# Patient Record
Sex: Female | Born: 1970 | Race: Black or African American | Hispanic: No | Smoking: Current every day smoker
Health system: Southern US, Community
[De-identification: ages and names within clinical notes are randomized; demographics above are authoritative.]

## PROBLEM LIST (undated history)

## (undated) DIAGNOSIS — T40601A Poisoning by unspecified narcotics, accidental (unintentional), initial encounter: Secondary | ICD-10-CM

## (undated) DIAGNOSIS — R569 Unspecified convulsions: Secondary | ICD-10-CM

## (undated) DIAGNOSIS — R55 Syncope and collapse: Secondary | ICD-10-CM

## (undated) HISTORY — PX: LEEP: SHX91

---

## 1997-03-22 ENCOUNTER — Ambulatory Visit (HOSPITAL_COMMUNITY): Admission: RE | Admit: 1997-03-22 | Discharge: 1997-03-22 | Payer: Self-pay | Admitting: Obstetrics

## 1998-09-06 ENCOUNTER — Encounter: Admission: RE | Admit: 1998-09-06 | Discharge: 1998-09-06 | Payer: Self-pay | Admitting: Obstetrics & Gynecology

## 1998-09-06 ENCOUNTER — Other Ambulatory Visit: Admission: RE | Admit: 1998-09-06 | Discharge: 1998-09-06 | Payer: Self-pay | Admitting: Obstetrics & Gynecology

## 1998-09-22 ENCOUNTER — Encounter: Admission: RE | Admit: 1998-09-22 | Discharge: 1998-09-22 | Payer: Self-pay | Admitting: Obstetrics

## 2003-06-11 ENCOUNTER — Emergency Department (HOSPITAL_COMMUNITY): Admission: EM | Admit: 2003-06-11 | Discharge: 2003-06-11 | Payer: Self-pay | Admitting: Emergency Medicine

## 2004-12-24 ENCOUNTER — Inpatient Hospital Stay (HOSPITAL_COMMUNITY): Admission: AD | Admit: 2004-12-24 | Discharge: 2004-12-24 | Payer: Self-pay | Admitting: *Deleted

## 2004-12-25 ENCOUNTER — Inpatient Hospital Stay (HOSPITAL_COMMUNITY): Admission: AD | Admit: 2004-12-25 | Discharge: 2004-12-25 | Payer: Self-pay | Admitting: Obstetrics and Gynecology

## 2006-03-27 ENCOUNTER — Emergency Department (HOSPITAL_COMMUNITY): Admission: EM | Admit: 2006-03-27 | Discharge: 2006-03-27 | Payer: Self-pay | Admitting: Family Medicine

## 2007-07-25 ENCOUNTER — Emergency Department (HOSPITAL_COMMUNITY): Admission: EM | Admit: 2007-07-25 | Discharge: 2007-07-25 | Payer: Self-pay | Admitting: Emergency Medicine

## 2007-07-29 ENCOUNTER — Emergency Department (HOSPITAL_COMMUNITY): Admission: EM | Admit: 2007-07-29 | Discharge: 2007-07-29 | Payer: Self-pay | Admitting: Emergency Medicine

## 2008-05-13 ENCOUNTER — Emergency Department (HOSPITAL_COMMUNITY): Admission: EM | Admit: 2008-05-13 | Discharge: 2008-05-13 | Payer: Self-pay | Admitting: Emergency Medicine

## 2008-10-06 ENCOUNTER — Emergency Department (HOSPITAL_COMMUNITY): Admission: EM | Admit: 2008-10-06 | Discharge: 2008-10-06 | Payer: Self-pay | Admitting: Emergency Medicine

## 2010-07-25 ENCOUNTER — Inpatient Hospital Stay (INDEPENDENT_AMBULATORY_CARE_PROVIDER_SITE_OTHER)
Admission: RE | Admit: 2010-07-25 | Discharge: 2010-07-25 | Disposition: A | Discharge status: Home / Self Care | Payer: Self-pay | Source: Ambulatory Visit | Attending: Family Medicine | Admitting: Family Medicine

## 2010-07-25 DIAGNOSIS — L03119 Cellulitis of unspecified part of limb: Secondary | ICD-10-CM

## 2010-07-28 ENCOUNTER — Inpatient Hospital Stay (INDEPENDENT_AMBULATORY_CARE_PROVIDER_SITE_OTHER)
Admission: RE | Admit: 2010-07-28 | Discharge: 2010-07-28 | Disposition: A | Discharge status: Home / Self Care | Payer: Self-pay | Source: Ambulatory Visit | Attending: Emergency Medicine | Admitting: Emergency Medicine

## 2010-07-28 DIAGNOSIS — L03119 Cellulitis of unspecified part of limb: Secondary | ICD-10-CM

## 2010-11-16 LAB — CULTURE, ROUTINE-ABSCESS

## 2011-05-09 ENCOUNTER — Emergency Department (INDEPENDENT_AMBULATORY_CARE_PROVIDER_SITE_OTHER)
Admission: EM | Admit: 2011-05-09 | Discharge: 2011-05-09 | Disposition: A | Discharge status: Home / Self Care | Payer: Self-pay | Source: Home / Self Care | Attending: Emergency Medicine | Admitting: Emergency Medicine

## 2011-05-09 ENCOUNTER — Encounter (HOSPITAL_COMMUNITY): Payer: Self-pay | Admitting: *Deleted

## 2011-05-09 DIAGNOSIS — H109 Unspecified conjunctivitis: Secondary | ICD-10-CM

## 2011-05-09 MED ORDER — ERYTHROMYCIN 5 MG/GM OP OINT: TOPICAL_OINTMENT | OPHTHALMIC | Status: AC

## 2011-05-09 MED ORDER — CIPROFLOXACIN HCL 0.3 % OP SOLN: OPHTHALMIC | Status: DC

## 2011-05-09 NOTE — ED Notes (Signed)
Pt  Reports  Symptoms  Of  Red   Irritated  r  Eye     Which  Started    Yesterday         Woke  Up  This  Am  With the  Eye  Matted somewhat       Pt  Has  allergys    And  Thought at first  That  That  Was the problem

## 2011-05-09 NOTE — Discharge Instructions (Signed)
Conjunctivitis Conjunctivitis is commonly called "pink eye." Conjunctivitis can be caused by bacterial or viral infection, allergies, or injuries. There is usually redness of the lining of the eye, itching, discomfort, and sometimes discharge. There may be deposits of matter along the eyelids. A viral infection usually causes a watery discharge, while a bacterial infection causes a yellowish, thick discharge. Pink eye is very contagious and spreads by direct contact. You may be given antibiotic eyedrops as part of your treatment. Before using your eye medicine, remove all drainage from the eye by washing gently with warm water and cotton balls. Continue to use the medication until you have awakened 2 mornings in a row without discharge from the eye. Do not rub your eye. This increases the irritation and helps spread infection. Use separate towels from other household members. Wash your hands with soap and water before and after touching your eyes. Use cold compresses to reduce pain and sunglasses to relieve irritation from light. Do not wear contact lenses or wear eye makeup until the infection is gone. SEEK MEDICAL CARE IF:   Your symptoms are not better after 3 days of treatment.   You have increased pain or trouble seeing.   The outer eyelids become very red or swollen.  Document Released: 03/15/2004 Document Revised: 01/25/2011 Document Reviewed: 02/05/2005 Exit[employer] Patient Information 2012 [employer]. 

## 2011-05-09 NOTE — ED Provider Notes (Signed)
Chief Complaint  Patient presents with  . Eye Problem    History of Present Illness:   The patient is a 41 year old female who has had a two-day history of redness and irritation of the right eye. Her vision has been somewhat blurred. She's had yellowish drainage with crusting, pain and photophobia. She also notes nasal congestion, rhinorrhea and cough. No fever or sore throat.  Review of Systems:  Other than noted above, the patient denies any of the following symptoms: Systemic:  No fever, chills, sweats, fatigue, or weight loss. Eye:  No redness, eye pain, photophobia, discharge, blurred vision, or diplopia. ENT:  No nasal congestion, rhinorrhea, or sore throat. Lymphatic:  No adenopathy. Skin:  No rash or pruritis.  PMFSH:  Past medical history, family history, social history, meds, and allergies were reviewed.  Physical Exam:   Vital signs:  BP 112/84  Pulse 95  Temp(Src) 98.5 F (36.9 C) (Oral)  Resp 18  SpO2 100%  LMP 04/16/2011 General:  Alert and in no distress. Eye:  Conjunctiva of the right eye was injected and there was some crusted yellowish drainage. The anterior chamber was normal and cornea was intact. ENT:  TMs and canals clear.  Nasal mucosa normal.  No intra-oral lesions, mucous membranes moist, pharynx clear. Neck:  No adenopathy tenderness or mass. Skin:  Clear, warm and dry.  Assessment:   Diagnoses that have been ruled out:  None  Diagnoses that are still under consideration:  None  Final diagnoses:  Conjunctivitis    Plan:   1.  The following meds were prescribed:   New Prescriptions   CIPROFLOXACIN (CILOXAN) 0.3 % OPHTHALMIC SOLUTION    1 drop in right eye every 3 hours while awake.   ERYTHROMYCIN OPHTHALMIC OINTMENT    Place a 1/2 inch ribbon of ointment into the lower eyelid at HS.   2.  The patient was instructed in symptomatic care and handouts were given. 3.  The patient was told to return if becoming worse in any way, if no better in 3 or 4  days, and given some red flag symptoms that would indicate earlier return.     Reuben Likes, MD 05/09/11 [phone]

## 2011-08-24 ENCOUNTER — Encounter (HOSPITAL_COMMUNITY): Payer: Self-pay | Admitting: *Deleted

## 2011-08-24 ENCOUNTER — Emergency Department (HOSPITAL_COMMUNITY)
Admission: EM | Admit: 2011-08-24 | Discharge: 2011-08-24 | Disposition: A | Discharge status: Home / Self Care | Payer: Self-pay | Attending: Emergency Medicine | Admitting: Emergency Medicine

## 2011-08-24 DIAGNOSIS — H109 Unspecified conjunctivitis: Secondary | ICD-10-CM | POA: Insufficient documentation

## 2011-08-24 DIAGNOSIS — F17200 Nicotine dependence, unspecified, uncomplicated: Secondary | ICD-10-CM | POA: Insufficient documentation

## 2011-08-24 MED ORDER — ERYTHROMYCIN 5 MG/GM OP OINT
TOPICAL_OINTMENT | Freq: Once | OPHTHALMIC | Status: AC
  Administered 2011-08-24: 06:00:00 via OPHTHALMIC
  Filled 2011-08-24: qty 3.5

## 2011-08-24 NOTE — ED Provider Notes (Signed)
Medical screening examination/treatment/procedure(s) were performed by non-physician practitioner and as supervising physician I was immediately available for consultation/collaboration.   Olivia Mackie, MD 08/24/11 [phone]

## 2011-08-24 NOTE — ED Notes (Signed)
Pt states she has "pink eye" in both of her eyes; pain/drainage

## 2011-08-24 NOTE — ED Provider Notes (Signed)
History     CSN: [id number]  Arrival date & time 08/24/11  0459   None     Chief Complaint  Patient presents with  . Eye Pain    (Consider location/radiation/quality/duration/timing/severity/associated sxs/prior treatment) HPI Comments: 4 days of eye swelling redness, itching and discharge   Patient is a 41 y.o. female presenting with eye pain. The history is provided by the patient.  Eye Pain This is a new problem. The current episode started yesterday. The problem occurs constantly. The problem has been unchanged. Pertinent negatives include no chills, fever, headaches or sore throat.    History reviewed. No pertinent past medical history.  History reviewed. No pertinent past surgical history.  No family history on file.  History  Substance Use Topics  . Smoking status: Current Everyday Smoker  . Smokeless tobacco: Not on file  . Alcohol Use: Yes    OB History    Grav Para Term Preterm Abortions TAB SAB Ect Mult Living                  Review of Systems  Constitutional: Negative for fever and chills.  HENT: Negative for sore throat.   Eyes: Positive for pain, discharge, redness and itching. Negative for photophobia.  Neurological: Negative for dizziness and headaches.    Allergies  Review of patient's allergies indicates no known allergies.  Home Medications   Current Outpatient Rx  Name Route Sig Dispense Refill  . CIPROFLOXACIN HCL 0.3 % OP SOLN  1 drop in right eye every 3 hours while awake. 2.5 mL 0    BP 109/70  Pulse 90  Temp 98.2 F (36.8 C) (Oral)  Resp 16  SpO2 99%  LMP 07/04/2011  Physical Exam  Constitutional: She is oriented to person, place, and time. She appears well-developed.  HENT:  Head: Normocephalic.  Eyes: EOM are normal. Pupils are equal, round, and reactive to light. Right eye exhibits discharge and exudate. Left eye exhibits discharge and exudate. Right conjunctiva is injected. Left conjunctiva is injected.  Neck: Normal  range of motion.  Cardiovascular: Normal rate and regular rhythm.   Musculoskeletal: Normal range of motion.  Neurological: She is alert and oriented to person, place, and time.  Skin: Skin is warm and dry. No rash noted.    ED Course  Procedures (including critical care time)  Labs Reviewed - No data to display No results found.   1. Conjunctivitis of both eyes       MDM  conjunctivitis       Arman Filter, NP 08/24/11 0537  Arman Filter, NP 08/24/11 [phone]

## 2011-10-11 ENCOUNTER — Emergency Department (HOSPITAL_COMMUNITY): Payer: No Typology Code available for payment source

## 2011-10-11 ENCOUNTER — Encounter (HOSPITAL_COMMUNITY): Payer: Self-pay | Admitting: Emergency Medicine

## 2011-10-11 ENCOUNTER — Emergency Department (HOSPITAL_COMMUNITY)
Admission: EM | Admit: 2011-10-11 | Discharge: 2011-10-11 | Disposition: A | Discharge status: Home / Self Care | Payer: No Typology Code available for payment source | Attending: Emergency Medicine | Admitting: Emergency Medicine

## 2011-10-11 DIAGNOSIS — Y92410 Unspecified street and highway as the place of occurrence of the external cause: Secondary | ICD-10-CM | POA: Insufficient documentation

## 2011-10-11 DIAGNOSIS — S139XXA Sprain of joints and ligaments of unspecified parts of neck, initial encounter: Secondary | ICD-10-CM | POA: Insufficient documentation

## 2011-10-11 DIAGNOSIS — F17200 Nicotine dependence, unspecified, uncomplicated: Secondary | ICD-10-CM | POA: Insufficient documentation

## 2011-10-11 DIAGNOSIS — S134XXA Sprain of ligaments of cervical spine, initial encounter: Secondary | ICD-10-CM

## 2011-10-11 DIAGNOSIS — R079 Chest pain, unspecified: Secondary | ICD-10-CM | POA: Insufficient documentation

## 2011-10-11 MED ORDER — CYCLOBENZAPRINE HCL 10 MG PO TABS: 10.0000 mg | ORAL_TABLET | Freq: Two times a day (BID) | ORAL | Status: AC | PRN

## 2011-10-11 MED ORDER — OXYCODONE-ACETAMINOPHEN 5-325 MG PO TABS
2.0000 | ORAL_TABLET | Freq: Once | ORAL | Status: AC
  Administered 2011-10-11: 2 via ORAL
  Filled 2011-10-11: qty 2

## 2011-10-11 MED ORDER — CYCLOBENZAPRINE HCL 10 MG PO TABS
10.0000 mg | ORAL_TABLET | Freq: Once | ORAL | Status: AC
  Administered 2011-10-11: 10 mg via ORAL
  Filled 2011-10-11: qty 1

## 2011-10-11 MED ORDER — OXYCODONE-ACETAMINOPHEN 5-325 MG PO TABS: 1.0000 | ORAL_TABLET | Freq: Four times a day (QID) | ORAL | Status: AC | PRN

## 2011-10-11 MED ORDER — IBUPROFEN 800 MG PO TABS: 800.0000 mg | ORAL_TABLET | Freq: Three times a day (TID) | ORAL | Status: AC
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
## 2011-10-12 NOTE — ED Provider Notes (Signed)
Medical screening examination/treatment/procedure(s) were performed by non-physician practitioner and as supervising physician I was immediately available for consultation/collaboration.  Raeford Razor, MD 10/12/11 [phone]

## 2014-10-01 ENCOUNTER — Emergency Department (HOSPITAL_COMMUNITY): Payer: Self-pay

## 2014-10-01 ENCOUNTER — Emergency Department (HOSPITAL_COMMUNITY)
Admission: EM | Admit: 2014-10-01 | Discharge: 2014-10-02 | Disposition: A | Discharge status: Home / Self Care | Payer: Self-pay | Attending: Emergency Medicine | Admitting: Emergency Medicine

## 2014-10-01 ENCOUNTER — Encounter (HOSPITAL_COMMUNITY): Payer: Self-pay | Admitting: *Deleted

## 2014-10-01 DIAGNOSIS — Z720 Tobacco use: Secondary | ICD-10-CM | POA: Insufficient documentation

## 2014-10-01 DIAGNOSIS — N10 Acute tubulo-interstitial nephritis: Secondary | ICD-10-CM | POA: Insufficient documentation

## 2014-10-01 DIAGNOSIS — R109 Unspecified abdominal pain: Secondary | ICD-10-CM

## 2014-10-01 DIAGNOSIS — Z3202 Encounter for pregnancy test, result negative: Secondary | ICD-10-CM | POA: Insufficient documentation

## 2014-10-01 DIAGNOSIS — Z79899 Other long term (current) drug therapy: Secondary | ICD-10-CM | POA: Insufficient documentation

## 2014-10-01 DIAGNOSIS — R000 Tachycardia, unspecified: Secondary | ICD-10-CM | POA: Insufficient documentation

## 2014-10-01 DIAGNOSIS — R630 Anorexia: Secondary | ICD-10-CM | POA: Insufficient documentation

## 2014-10-01 LAB — URINALYSIS, ROUTINE W REFLEX MICROSCOPIC
Bilirubin Urine: NEGATIVE
Glucose, UA: NEGATIVE mg/dL
HGB URINE DIPSTICK: NEGATIVE
Ketones, ur: NEGATIVE mg/dL
NITRITE: NEGATIVE
Protein, ur: NEGATIVE mg/dL
SPECIFIC GRAVITY, URINE: 1.014 (ref 1.005–1.030)
Urobilinogen, UA: 0.2 mg/dL (ref 0.0–1.0)
pH: 7 (ref 5.0–8.0)

## 2014-10-01 LAB — COMPREHENSIVE METABOLIC PANEL
ALT: 14 U/L (ref 14–54)
AST: 19 U/L (ref 15–41)
Albumin: 3.7 g/dL (ref 3.5–5.0)
Alkaline Phosphatase: 77 U/L (ref 38–126)
Anion gap: 7 (ref 5–15)
BUN: 9 mg/dL (ref 6–20)
CALCIUM: 8.9 mg/dL (ref 8.9–10.3)
CO2: 26 mmol/L (ref 22–32)
CREATININE: 1.06 mg/dL — AB (ref 0.44–1.00)
Chloride: 105 mmol/L (ref 101–111)
GFR calc non Af Amer: >60 mL/min (ref >60)
GLUCOSE: 100 mg/dL — AB (ref 65–99)
Potassium: 4.4 mmol/L (ref 3.5–5.1)
Sodium: 138 mmol/L (ref 135–145)
Total Bilirubin: 0.2 mg/dL — ABNORMAL LOW (ref 0.3–1.2)
Total Protein: 7.4 g/dL (ref 6.5–8.1)

## 2014-10-01 LAB — CBC
HCT: 39.9 % (ref 36.0–46.0)
Hemoglobin: 13.5 g/dL (ref 12.0–15.0)
MCH: 33.1 pg (ref 26.0–34.0)
MCHC: 33.8 g/dL (ref 30.0–36.0)
MCV: 97.8 fL (ref 78.0–100.0)
Platelets: 242 10*3/uL (ref 150–400)
RBC: 4.08 MIL/uL (ref 3.87–5.11)
RDW: 14 % (ref 11.5–15.5)
WBC: 11.3 10*3/uL — ABNORMAL HIGH (ref 4.0–10.5)

## 2014-10-01 LAB — I-STAT BETA HCG BLOOD, ED (MC, WL, AP ONLY)

## 2014-10-01 LAB — URINE MICROSCOPIC-ADD ON

## 2014-10-01 LAB — LIPASE, BLOOD: Lipase: 42 U/L (ref 22–51)

## 2014-10-01 MED ORDER — MORPHINE SULFATE 4 MG/ML IJ SOLN
4.0000 mg | Freq: Once | INTRAMUSCULAR | Status: AC
  Administered 2014-10-01: 4 mg via INTRAVENOUS
  Filled 2014-10-01: qty 1

## 2014-10-01 MED ORDER — ONDANSETRON HCL 4 MG/2ML IJ SOLN
4.0000 mg | Freq: Once | INTRAMUSCULAR | Status: AC
  Administered 2014-10-01: 4 mg via INTRAVENOUS
  Filled 2014-10-01: qty 2

## 2014-10-01 MED ORDER — DEXTROSE 5 % IV SOLN
1.0000 g | INTRAVENOUS | Status: DC
  Administered 2014-10-01: 1 g via INTRAVENOUS
  Filled 2014-10-01: qty 10

## 2014-10-01 MED ORDER — IOHEXOL 300 MG/ML  SOLN
50.0000 mL | Freq: Once | INTRAMUSCULAR | Status: AC | PRN
  Administered 2014-10-01: 50 mL via ORAL

## 2014-10-01 MED ORDER — SODIUM CHLORIDE 0.9 % IV BOLUS (SEPSIS)
1000.0000 mL | Freq: Once | INTRAVENOUS | Status: AC
  Administered 2014-10-01: 1000 mL via INTRAVENOUS

## 2014-10-01 NOTE — ED Provider Notes (Signed)
CSN: [id number]     Arrival date & time 10/01/14  2106 History   First MD Initiated Contact with Patient 10/01/14 2213     Chief Complaint  Patient presents with  . Abdominal Pain  . Fever     (Consider location/radiation/quality/duration/timing/severity/associated sxs/prior Treatment) HPI Comments: 44 year old female with no significant medical history, current smoker presents with right mid abdominal pain no focal radiation and intermittent fevers for the past 3 days. Patient has no abdominal surgery history. Intermittent nausea. Currently moderate pain, no urinary symptoms. Symptoms improved with anti-paretic.  Patient is a 44 y.o. female presenting with abdominal pain and fever. The history is provided by the patient.  Abdominal Pain Associated symptoms: fever   Associated symptoms: no chest pain, no chills, no dysuria, no shortness of breath and no vomiting   Fever Associated symptoms: no chest pain, no chills, no congestion, no dysuria, no headaches, no rash and no vomiting     History reviewed. No pertinent past medical history. History reviewed. No pertinent past surgical history. Family History  Problem Relation Age of Onset  . Diabetes Mother    Social History  Substance Use Topics  . Smoking status: Current Every Day Smoker  . Smokeless tobacco: None  . Alcohol Use: Yes   OB History    No data available     Review of Systems  Constitutional: Positive for fever and appetite change. Negative for chills.  HENT: Negative for congestion.   Eyes: Negative for visual disturbance.  Respiratory: Negative for shortness of breath.   Cardiovascular: Negative for chest pain.  Gastrointestinal: Positive for abdominal pain. Negative for vomiting.  Genitourinary: Negative for dysuria and flank pain.  Musculoskeletal: Negative for back pain, neck pain and neck stiffness.  Skin: Negative for rash.  Neurological: Negative for light-headedness and headaches.      Allergies   Review of patient's allergies indicates no known allergies.  Home Medications   Prior to Admission medications   Medication Sig Start Date End Date Taking? Authorizing Provider  Multiple Vitamin (MULTIVITAMIN WITH MINERALS) TABS Take 1 tablet by mouth daily.    Historical Provider, MD   BP 122/78 mmHg  Pulse 112  Temp(Src) 98.7 F (37.1 C)  Resp 18  SpO2 97%  LMP 08/18/2014 Physical Exam  Constitutional: She is oriented to person, place, and time. She appears well-developed and well-nourished.  HENT:  Head: Normocephalic and atraumatic.  Mild dry mucous membranes  Eyes: Conjunctivae are normal. Right eye exhibits no discharge. Left eye exhibits no discharge.  Neck: Normal range of motion. Neck supple. No tracheal deviation present.  Cardiovascular: Regular rhythm.  Tachycardia present.   Pulmonary/Chest: Effort normal and breath sounds normal.  Abdominal: Soft. She exhibits no distension. There is tenderness (right lateral abdomen right flank region). There is no guarding.  Musculoskeletal: She exhibits no edema.  Neurological: She is alert and oriented to person, place, and time.  Skin: Skin is warm. No rash noted.  Psychiatric: She has a normal mood and affect.  Nursing note and vitals reviewed.   ED Course  Procedures (including critical care time)  EMERGENCY DEPARTMENT BILIARY ULTRASOUND INTERPRETATION "Study: Limited Abdominal Ultrasound of the gallbladder and common bile duct."  INDICATIONS: RUQ pain Indication: Multiple views of the gallbladder and common bile duct were obtained in real-time with a Multi-frequency probe." PERFORMED BY:  Myself IMAGES ARCHIVED?: Yes FINDINGS: Gallstones absent and Gallbladder wall normal in thickness LIMITATIONS: Abdominal pain INTERPRETATION: Normal distal  CPT Code [phone] (limited abdominal)  Labs Review Labs Reviewed  COMPREHENSIVE METABOLIC PANEL - Abnormal; Notable for the following:    Glucose, Bld 100 (*)     Creatinine, Ser 1.06 (*)    Total Bilirubin 0.2 (*)    All other components within normal limits  CBC - Abnormal; Notable for the following:    WBC 11.3 (*)    All other components within normal limits  URINALYSIS, ROUTINE W REFLEX MICROSCOPIC (NOT AT [facility]) - Abnormal; Notable for the following:    APPearance CLOUDY (*)    Leukocytes, UA MODERATE (*)    All other components within normal limits  URINE MICROSCOPIC-ADD ON - Abnormal; Notable for the following:    Bacteria, UA FEW (*)    All other components within normal limits  LIPASE, BLOOD  I-STAT BETA HCG BLOOD, ED ([facility], [facility], [facility] ONLY)    Imaging Review No results found. I, Enid Skeens, personally reviewed and evaluated these images and lab results as part of my medical decision-making.   EKG Interpretation None      MDM   Final diagnoses:  Acute pyelonephritis  Right lateral abdominal pain   Patient presents with right mid abdominal pain and fevers. Bedside ultrasound difficult views however no obvious wall thickening or fluid around the gallbladder. CT scan ordered for further delineation to look for appendicitis or more details of the gallbladder. Patient has moderate leukocytes however few bacteria. Rocephin given for possible pyelonephritis however CT scan required for other diagnosis.  CT scan results reviewed acute pyelonephritis. Anabolic semifluid given the ER. Plan for pain meds oral anabolic close follow-up outpatient.\\ Results and differential diagnosis were discussed with the patient/parent/guardian. Xrays were independently reviewed by myself.  Close follow up outpatient was discussed, comfortable with the plan.   Medications  cefTRIAXone (ROCEPHIN) 1 g in dextrose 5 % 50 mL IVPB (1 g Intravenous New Bag/Given 10/01/14 2354)  sodium chloride 0.9 % bolus 1,000 mL (1,000 mLs Intravenous New Bag/Given 10/01/14 2345)  morphine 4 MG/ML injection 4 mg (4 mg Intravenous Given 10/01/14 2354)  iohexol (OMNIPAQUE)  300 MG/ML solution 50 mL (50 mLs Oral Contrast Given 10/01/14 2346)  ondansetron (ZOFRAN) injection 4 mg (4 mg Intravenous Given 10/01/14 2354)  iohexol (OMNIPAQUE) 300 MG/ML solution 100 mL (100 mLs Intravenous Contrast Given 10/02/14 0016)    Filed Vitals:   10/01/14 2111 10/01/14 2357  BP: 122/78 102/71  Pulse: 112 101  Temp: 98.7 F (37.1 C) 98.7 F (37.1 C)  TempSrc:  Oral  Resp: 18 18  Height:  5' 6.5" (1.689 m)  Weight:  155 lb (70.308 kg)  SpO2: 97% 97%    Final diagnoses:  Acute pyelonephritis  Right lateral abdominal pain       Blane Ohara, MD 10/02/14 0101

## 2014-10-01 NOTE — ED Notes (Signed)
Pt complains of fever, 9/10 RLQ abdominal pain for the past 3 days. Pt denies n/v/d. Pt still has her appendix.

## 2014-10-02 MED ORDER — NAPROXEN 375 MG PO TABS: 375.0000 mg | ORAL_TABLET | Freq: Two times a day (BID) | ORAL | Status: DC | PRN

## 2014-10-02 MED ORDER — IOHEXOL 300 MG/ML  SOLN
100.0000 mL | Freq: Once | INTRAMUSCULAR | Status: AC | PRN
  Administered 2014-10-02: 100 mL via INTRAVENOUS

## 2014-10-02 MED ORDER — HYDROCODONE-ACETAMINOPHEN 5-325 MG PO TABS: 1.0000 | ORAL_TABLET | ORAL | Status: DC | PRN

## 2014-10-02 MED ORDER — CIPROFLOXACIN HCL 500 MG PO TABS: 500.0000 mg | ORAL_TABLET | Freq: Two times a day (BID) | ORAL | Status: DC

## 2014-10-02 NOTE — Discharge Instructions (Signed)
If you were given medicines take as directed.  If you are on coumadin or contraceptives realize their levels and effectiveness is altered by many different medicines.  If you have any reaction (rash, tongues swelling, other) to the medicines stop taking and see a physician.    If your blood pressure was elevated in the ER make sure you follow up for management with a primary doctor or return for chest pain, shortness of breath or stroke symptoms.  Please follow up as directed and return to the ER or see a physician for new or worsening symptoms.  Thank you. Filed Vitals:   10/01/14 2111 10/01/14 2357  BP: 122/78 102/71  Pulse: 112 101  Temp: 98.7 F (37.1 C) 98.7 F (37.1 C)  TempSrc:  Oral  Resp: 18 18  Height:  5' 6.5" (1.689 m)  Weight:  155 lb (70.308 kg)  SpO2: 97% 97%

## 2017-02-23 ENCOUNTER — Emergency Department (HOSPITAL_COMMUNITY): Payer: No Typology Code available for payment source

## 2017-02-23 ENCOUNTER — Emergency Department (HOSPITAL_COMMUNITY)
Admission: EM | Admit: 2017-02-23 | Discharge: 2017-02-23 | Disposition: A | Discharge status: Home / Self Care | Payer: No Typology Code available for payment source | Attending: Emergency Medicine | Admitting: Emergency Medicine

## 2017-02-23 ENCOUNTER — Other Ambulatory Visit: Payer: Self-pay

## 2017-02-23 ENCOUNTER — Encounter (HOSPITAL_COMMUNITY): Payer: Self-pay

## 2017-02-23 DIAGNOSIS — Z79899 Other long term (current) drug therapy: Secondary | ICD-10-CM | POA: Insufficient documentation

## 2017-02-23 DIAGNOSIS — F17210 Nicotine dependence, cigarettes, uncomplicated: Secondary | ICD-10-CM | POA: Insufficient documentation

## 2017-02-23 DIAGNOSIS — Y999 Unspecified external cause status: Secondary | ICD-10-CM | POA: Diagnosis not present

## 2017-02-23 DIAGNOSIS — Y9389 Activity, other specified: Secondary | ICD-10-CM | POA: Insufficient documentation

## 2017-02-23 DIAGNOSIS — R1030 Lower abdominal pain, unspecified: Secondary | ICD-10-CM | POA: Diagnosis present

## 2017-02-23 DIAGNOSIS — Z23 Encounter for immunization: Secondary | ICD-10-CM | POA: Diagnosis not present

## 2017-02-23 DIAGNOSIS — Y929 Unspecified place or not applicable: Secondary | ICD-10-CM | POA: Insufficient documentation

## 2017-02-23 LAB — I-STAT CHEM 8, ED
CREATININE: 0.8 mg/dL (ref 0.44–1.00)
Calcium, Ion: 1.15 mmol/L (ref 1.15–1.40)
Chloride: 107 mmol/L (ref 101–111)
GLUCOSE: 75 mg/dL (ref 65–99)
HEMATOCRIT: 45 % (ref 36.0–46.0)
Hemoglobin: 15.3 g/dL — ABNORMAL HIGH (ref 12.0–15.0)
Potassium: 4.1 mmol/L (ref 3.5–5.1)
Sodium: 139 mmol/L (ref 135–145)
TCO2: 23 mmol/L (ref 22–32)

## 2017-02-23 LAB — I-STAT BETA HCG BLOOD, ED (MC, WL, AP ONLY): I-stat hCG, quantitative: <5 m[IU]/mL (ref <5)

## 2017-02-23 MED ORDER — ACETAMINOPHEN 325 MG PO TABS
650.0000 mg | ORAL_TABLET | Freq: Once | ORAL | Status: AC
  Administered 2017-02-23: 650 mg via ORAL
  Filled 2017-02-23: qty 2

## 2017-02-23 MED ORDER — IBUPROFEN 600 MG PO TABS: 600.0000 mg | ORAL_TABLET | Freq: Four times a day (QID) | ORAL | 0 refills | Status: DC | PRN

## 2017-02-23 MED ORDER — IBUPROFEN 200 MG PO TABS: 600.0000 mg | ORAL_TABLET | Freq: Once | ORAL | Status: DC

## 2017-02-23 MED ORDER — IOPAMIDOL (ISOVUE-300) INJECTION 61%
100.0000 mL | Freq: Once | INTRAVENOUS | Status: AC | PRN
  Administered 2017-02-23: 100 mL via INTRAVENOUS

## 2017-02-23 MED ORDER — TETANUS-DIPHTH-ACELL PERTUSSIS 5-2.5-18.5 LF-MCG/0.5 IM SUSP
0.5000 mL | Freq: Once | INTRAMUSCULAR | Status: AC
  Administered 2017-02-23: 0.5 mL via INTRAMUSCULAR
  Filled 2017-02-23: qty 0.5

## 2017-02-23 MED ORDER — METHOCARBAMOL 500 MG PO TABS: 500.0000 mg | ORAL_TABLET | Freq: Two times a day (BID) | ORAL | 0 refills | Status: DC

## 2017-02-23 MED ORDER — IOPAMIDOL (ISOVUE-300) INJECTION 61%
INTRAVENOUS | Status: AC
  Filled 2017-02-23: qty 100

## 2017-02-23 MED ORDER — METHOCARBAMOL 500 MG PO TABS
500.0000 mg | ORAL_TABLET | Freq: Once | ORAL | Status: AC
  Administered 2017-02-23: 500 mg via ORAL
  Filled 2017-02-23: qty 1
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
## 2017-02-23 NOTE — ED Notes (Signed)
Patient transported to CT 
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
## 2017-06-01 ENCOUNTER — Encounter (HOSPITAL_COMMUNITY): Payer: Self-pay | Admitting: Emergency Medicine

## 2017-06-01 ENCOUNTER — Emergency Department (HOSPITAL_COMMUNITY): Payer: Self-pay

## 2017-06-01 ENCOUNTER — Emergency Department (HOSPITAL_COMMUNITY)
Admission: EM | Admit: 2017-06-01 | Discharge: 2017-06-01 | Disposition: A | Discharge status: Home / Self Care | Payer: Self-pay | Attending: Physician Assistant | Admitting: Physician Assistant

## 2017-06-01 DIAGNOSIS — R112 Nausea with vomiting, unspecified: Secondary | ICD-10-CM | POA: Insufficient documentation

## 2017-06-01 DIAGNOSIS — K5900 Constipation, unspecified: Secondary | ICD-10-CM

## 2017-06-01 DIAGNOSIS — N390 Urinary tract infection, site not specified: Secondary | ICD-10-CM

## 2017-06-01 DIAGNOSIS — Z79899 Other long term (current) drug therapy: Secondary | ICD-10-CM | POA: Insufficient documentation

## 2017-06-01 DIAGNOSIS — F17210 Nicotine dependence, cigarettes, uncomplicated: Secondary | ICD-10-CM | POA: Insufficient documentation

## 2017-06-01 LAB — COMPREHENSIVE METABOLIC PANEL
ALBUMIN: 4 g/dL (ref 3.5–5.0)
ALK PHOS: 82 U/L (ref 38–126)
ALT: 25 U/L (ref 14–54)
ANION GAP: 13 (ref 5–15)
AST: 45 U/L — ABNORMAL HIGH (ref 15–41)
BUN: 8 mg/dL (ref 6–20)
CALCIUM: 9.2 mg/dL (ref 8.9–10.3)
CHLORIDE: 105 mmol/L (ref 101–111)
CO2: 25 mmol/L (ref 22–32)
Creatinine, Ser: 0.97 mg/dL (ref 0.44–1.00)
GFR calc Af Amer: >60 mL/min (ref >60)
GFR calc non Af Amer: >60 mL/min (ref >60)
GLUCOSE: 85 mg/dL (ref 65–99)
Potassium: 3.4 mmol/L — ABNORMAL LOW (ref 3.5–5.1)
Sodium: 143 mmol/L (ref 135–145)
Total Bilirubin: 0.6 mg/dL (ref 0.3–1.2)
Total Protein: 7.6 g/dL (ref 6.5–8.1)

## 2017-06-01 LAB — I-STAT BETA HCG BLOOD, ED (MC, WL, AP ONLY)

## 2017-06-01 LAB — URINALYSIS, ROUTINE W REFLEX MICROSCOPIC
BILIRUBIN URINE: NEGATIVE
Glucose, UA: NEGATIVE mg/dL
Hgb urine dipstick: NEGATIVE
KETONES UR: 5 mg/dL — AB
Nitrite: NEGATIVE
PROTEIN: 100 mg/dL — AB
Specific Gravity, Urine: 1.025 (ref 1.005–1.030)
pH: 5 (ref 5.0–8.0)

## 2017-06-01 LAB — CBC
HCT: 39.3 % (ref 36.0–46.0)
HEMOGLOBIN: 14 g/dL (ref 12.0–15.0)
MCH: 35.7 pg — AB (ref 26.0–34.0)
MCHC: 35.6 g/dL (ref 30.0–36.0)
MCV: 100.3 fL — AB (ref 78.0–100.0)
Platelets: 243 10*3/uL (ref 150–400)
RBC: 3.92 MIL/uL (ref 3.87–5.11)
RDW: 13.7 % (ref 11.5–15.5)
WBC: 14.1 10*3/uL — ABNORMAL HIGH (ref 4.0–10.5)

## 2017-06-01 LAB — LIPASE, BLOOD: LIPASE: 28 U/L (ref 11–51)

## 2017-06-01 MED ORDER — POLYETHYLENE GLYCOL 3350 17 G PO PACK: 17.0000 g | PACK | Freq: Every day | ORAL | 0 refills | Status: DC

## 2017-06-01 MED ORDER — KETOROLAC TROMETHAMINE 30 MG/ML IJ SOLN
30.0000 mg | Freq: Once | INTRAMUSCULAR | Status: AC
  Administered 2017-06-01: 30 mg via INTRAVENOUS
  Filled 2017-06-01: qty 1

## 2017-06-01 MED ORDER — SODIUM CHLORIDE 0.9 % IV BOLUS
1000.0000 mL | Freq: Once | INTRAVENOUS | Status: AC
  Administered 2017-06-01: 1000 mL via INTRAVENOUS

## 2017-06-01 MED ORDER — CEPHALEXIN 500 MG PO CAPS: 500.0000 mg | ORAL_CAPSULE | Freq: Four times a day (QID) | ORAL | 0 refills | Status: AC

## 2017-06-01 MED ORDER — CEPHALEXIN 500 MG PO CAPS
500.0000 mg | ORAL_CAPSULE | Freq: Once | ORAL | Status: AC
  Administered 2017-06-01: 500 mg via ORAL
  Filled 2017-06-01: qty 1

## 2017-06-01 MED ORDER — ONDANSETRON HCL 4 MG/2ML IJ SOLN
4.0000 mg | Freq: Once | INTRAMUSCULAR | Status: AC
  Administered 2017-06-01: 4 mg via INTRAVENOUS
  Filled 2017-06-01: qty 2

## 2017-06-01 NOTE — ED Triage Notes (Addendum)
Patient here from home with complaints of crampy lower abdominal pain radiating around to back. Nausea, vomiting x2 days.Reports back pain increased when urinating.

## 2017-06-01 NOTE — ED Provider Notes (Signed)
[facility]-EMERGENCY DEPT Provider Note   CSN: [id number] Arrival date & time: 06/01/17  1344     History   Chief Complaint Chief Complaint  Patient presents with  . Nausea  . Emesis  . Back Pain  . Abdominal Pain    HPI Molly Murillo is a 47 y.o. female presents to ED for evaluation of 35-month history of epigastric abdominal pain, mid abdominal pain, nausea, vomiting.  She also reports urinary pressure when urinating.  States that the abdominal pain intermittently radiates to her back.  She also reports changes in her bowel movements stating that she sometimes experiences diarrhea and sometimes constipation.  When asked why she is seeking treatment today and not in the past, she states that "I just cannot take it anymore."  She has tried Imodium and BC powders with mild improvement in her symptoms.  S Denies any chest pain, shortness of breath, hematemesis, hematochezia or melena, hematuria, fevers, sick contacts with similar symptoms.  She reports daily tobacco and alcohol use including 1-2 beers daily.  Denies any other drug use.  HPI  History reviewed. No pertinent past medical history.  There are no active problems to display for this patient.   History reviewed. No pertinent surgical history.   OB History   None      Home Medications    Prior to Admission medications   Medication Sig Start Date End Date Taking? Authorizing Provider  cephALEXin (KEFLEX) 500 MG capsule Take 1 capsule (500 mg total) by mouth 4 (four) times daily for 10 days. 06/01/17 06/11/17  Rockie Schnoor, PA-C  ciprofloxacin (CIPRO) 500 MG tablet Take 1 tablet (500 mg total) by mouth 2 (two) times daily. One po bid x 7 days 10/02/14   Blane Ohara, MD  HYDROcodone-acetaminophen [facility]) 5-325 MG per tablet Take 1-2 tablets by mouth every 4 (four) hours as needed. 10/02/14   Blane Ohara, MD  ibuprofen (ADVIL,MOTRIN) 600 MG tablet Take 1 tablet (600 mg total) by mouth every 6  (six) hours as needed. 02/23/17   Aviva Kluver B, PA-C  methocarbamol (ROBAXIN) 500 MG tablet Take 1 tablet (500 mg total) by mouth 2 (two) times daily. 02/23/17   Aviva Kluver B, PA-C  Multiple Vitamin (MULTIVITAMIN WITH MINERALS) TABS Take 1 tablet by mouth daily.    [provider]  naproxen (NAPROSYN) 375 MG tablet Take 1 tablet (375 mg total) by mouth 2 (two) times daily as needed. 10/02/14   Blane Ohara, MD  polyethylene glycol (MIRALAX / GLYCOLAX) packet Take 17 g by mouth daily. 06/01/17   Dietrich Pates, PA-C    Family History Family History  Problem Relation Age of Onset  . Diabetes Mother     Social History Social History   Tobacco Use  . Smoking status: Current Every Day Smoker    Packs/day: 0.50    Types: Cigarettes  . Smokeless tobacco: Never Used  Substance Use Topics  . Alcohol use: Yes    Comment: occasional  . Drug use: No     Allergies   Patient has no known allergies.   Review of Systems Review of Systems  Constitutional: Positive for fatigue. Negative for appetite change, chills and fever.  HENT: Negative for ear pain, rhinorrhea, sneezing and sore throat.   Eyes: Negative for photophobia and visual disturbance.  Respiratory: Positive for cough. Negative for chest tightness, shortness of breath and wheezing.   Cardiovascular: Negative for chest pain and palpitations.  Gastrointestinal: Positive for abdominal pain,  constipation, diarrhea, nausea and vomiting. Negative for blood in stool.  Genitourinary: Negative for dysuria, hematuria and urgency.  Musculoskeletal: Negative for myalgias.  Skin: Negative for rash.  Neurological: Negative for dizziness, weakness and light-headedness.     Physical Exam Updated Vital Signs BP (!) 132/96 (BP Location: Left Arm)   Pulse 82   Temp 97.9 F (36.6 C) (Oral)   Resp 20   LMP 05/24/2017   SpO2 100%   Physical Exam  Constitutional: She appears well-developed and well-nourished. No distress.    Nontoxic-appearing and in no acute distress.  Speaking complete sentences without difficulty.  HENT:  Head: Normocephalic and atraumatic.  Nose: Nose normal.  Eyes: Conjunctivae and EOM are normal. Left eye exhibits no discharge. No scleral icterus.  Neck: Normal range of motion. Neck supple.  Cardiovascular: Normal rate, regular rhythm, normal heart sounds and intact distal pulses. Exam reveals no gallop and no friction rub.  No murmur heard. Pulmonary/Chest: Effort normal and breath sounds normal. No respiratory distress.  Abdominal: Soft. Bowel sounds are normal. She exhibits no distension. There is tenderness. There is no rigidity, no rebound, no guarding, no CVA tenderness and no tenderness at McBurney's point.    No CVA tenderness bilaterally.  Musculoskeletal: Normal range of motion. She exhibits no edema.  Neurological: She is alert. She exhibits normal muscle tone. Coordination normal.  Skin: Skin is warm and dry. No rash noted.  Psychiatric: She has a normal mood and affect.  Nursing note and vitals reviewed.    ED Treatments / Results  Labs (all labs ordered are listed, but only abnormal results are displayed) Labs Reviewed  COMPREHENSIVE METABOLIC PANEL - Abnormal; Notable for the following components:      Result Value   Potassium 3.4 (*)    AST 45 (*)    All other components within normal limits  CBC - Abnormal; Notable for the following components:   WBC 14.1 (*)    MCV 100.3 (*)    MCH 35.7 (*)    All other components within normal limits  URINALYSIS, ROUTINE W REFLEX MICROSCOPIC - Abnormal; Notable for the following components:   Color, Urine AMBER (*)    APPearance HAZY (*)    Ketones, ur 5 (*)    Protein, ur 100 (*)    Leukocytes, UA MODERATE (*)    Bacteria, UA RARE (*)    Squamous Epithelial / LPF 6-30 (*)    All other components within normal limits  URINE CULTURE  LIPASE, BLOOD  I-STAT BETA HCG BLOOD, ED ([facility], [facility], [facility] ONLY)     EKG None  Radiology Dg Abdomen Acute W/chest  Result Date: 06/01/2017 CLINICAL DATA:  Shortness of breath with nausea, vomiting, abdominal pain EXAM: DG ABDOMEN ACUTE W/ 1V CHEST COMPARISON:  CT abdomen and pelvis February 23, 2017; chest radiograph February 23, 2017 FINDINGS: PA chest: Lungs are clear. Heart size and pulmonary vascularity are normal. No adenopathy. Supine and upright abdomen: There is moderate stool in the colon. There is no bowel dilatation or air-fluid level to suggest bowel obstruction. No free air. There are phleboliths in the pelvis. IMPRESSION: No evident bowel obstruction or free air. Moderate stool in colon. Lungs clear. Electronically Signed   By: Bretta Bang III M.D.   On: 06/01/2017 15:32    Procedures Procedures (including critical care time)  Medications Ordered in ED Medications  sodium chloride 0.9 % bolus 1,000 mL (1,000 mLs Intravenous New Bag/Given 06/01/17 1452)  ketorolac (TORADOL) 30 MG/ML injection  30 mg (has no administration in time range)  cephALEXin (KEFLEX) capsule 500 mg (has no administration in time range)     Initial Impression / Assessment and Plan / ED Course  I have reviewed the triage vital signs and the nursing notes.  Pertinent labs & imaging results that were available during my care of the patient were reviewed by me and considered in my medical decision making (see chart for details).     Patient presents to ED for evaluation of 590-month history of epigastric abdominal pain, mid abdominal pain, nausea, vomiting.  She also reports urinary pressure when urinating.  She reports intermittent changes to her bowel movements as well including sometimes having diarrhea and sometimes constipation.  On physical exam she does have some upper abdominal tenderness to palpation with me rebound or guarding present.  She has no CVA tenderness bilaterally.  She is afebrile with no recent use of antipyretics.  Lab work significant for  urinalysis with leukocytes, bacteria.  This will be sent for culture.  Also shows WBCs.  Leukocytosis at 14 which is likely reactive or due to her infection.  X-ray of the abdomen shows moderate amount of stool which could also be the cause of her discomfort.  She has no right lower quadrant abdominal tenderness to palpation that would concern me for appendicitis.  I doubt other surgical cause of symptoms.  Patient given antibiotics, Toradol with improvement in her symptoms here.  Will discharge home with remainder of antibiotic course, laxative.  Advised to follow-up at [facility] for further evaluation if symptoms persist.  Advised to return for any severe worsening symptoms.  Portions of this note were generated with Scientist, clinical (histocompatibility and immunogenetics)Dragon dictation software. Dictation errors may occur despite best attempts at proofreading.   Final Clinical Impressions(s) / ED Diagnoses   Final diagnoses:  Lower urinary tract infectious disease  Constipation, unspecified constipation type    ED Discharge Orders        Ordered    cephALEXin (KEFLEX) 500 MG capsule  4 times daily     06/01/17 1536    polyethylene glycol (MIRALAX / GLYCOLAX) packet  Daily     06/01/17 1536       Dietrich PatesKhatri, Huntleigh Doolen, PA-C 06/01/17 1537    Mackuen, Cindee Saltourteney Lyn, MD 06/03/17 1549

## 2018-03-14 ENCOUNTER — Other Ambulatory Visit: Payer: Self-pay | Admitting: Family Medicine

## 2018-03-18 ENCOUNTER — Other Ambulatory Visit (HOSPITAL_COMMUNITY): Payer: Self-pay | Admitting: *Deleted

## 2018-03-18 DIAGNOSIS — Z1231 Encounter for screening mammogram for malignant neoplasm of breast: Secondary | ICD-10-CM

## 2018-06-17 ENCOUNTER — Ambulatory Visit (HOSPITAL_COMMUNITY): Payer: Self-pay

## 2019-07-26 ENCOUNTER — Encounter (HOSPITAL_COMMUNITY): Payer: Self-pay

## 2019-07-26 ENCOUNTER — Other Ambulatory Visit: Payer: Self-pay

## 2019-07-26 ENCOUNTER — Emergency Department (HOSPITAL_COMMUNITY)
Admission: EM | Admit: 2019-07-26 | Discharge: 2019-07-27 | Disposition: A | Discharge status: Home / Self Care | Payer: Self-pay | Attending: Emergency Medicine | Admitting: Emergency Medicine

## 2019-07-26 ENCOUNTER — Emergency Department (HOSPITAL_COMMUNITY): Payer: Self-pay

## 2019-07-26 DIAGNOSIS — Z79899 Other long term (current) drug therapy: Secondary | ICD-10-CM | POA: Insufficient documentation

## 2019-07-26 DIAGNOSIS — Z7982 Long term (current) use of aspirin: Secondary | ICD-10-CM | POA: Insufficient documentation

## 2019-07-26 DIAGNOSIS — R55 Syncope and collapse: Secondary | ICD-10-CM | POA: Insufficient documentation

## 2019-07-26 DIAGNOSIS — F17210 Nicotine dependence, cigarettes, uncomplicated: Secondary | ICD-10-CM | POA: Insufficient documentation

## 2019-07-26 LAB — BASIC METABOLIC PANEL WITH GFR
Anion gap: 11 (ref 5–15)
BUN: <5 mg/dL — ABNORMAL LOW (ref 6–20)
CO2: 23 mmol/L (ref 22–32)
Calcium: 8.8 mg/dL — ABNORMAL LOW (ref 8.9–10.3)
Chloride: 103 mmol/L (ref 98–111)
Creatinine, Ser: 0.72 mg/dL (ref 0.44–1.00)
GFR calc Af Amer: >60 mL/min
GFR calc non Af Amer: >60 mL/min
Glucose, Bld: 94 mg/dL (ref 70–99)
Potassium: 3.6 mmol/L (ref 3.5–5.1)
Sodium: 137 mmol/L (ref 135–145)

## 2019-07-26 LAB — I-STAT BETA HCG BLOOD, ED (MC, WL, AP ONLY): I-stat hCG, quantitative: <5 m[IU]/mL

## 2019-07-26 LAB — CBC
HCT: 36.1 % (ref 36.0–46.0)
Hemoglobin: 12.2 g/dL (ref 12.0–15.0)
MCH: 35.1 pg — ABNORMAL HIGH (ref 26.0–34.0)
MCHC: 33.8 g/dL (ref 30.0–36.0)
MCV: 103.7 fL — ABNORMAL HIGH (ref 80.0–100.0)
Platelets: 157 10*3/uL (ref 150–400)
RBC: 3.48 MIL/uL — ABNORMAL LOW (ref 3.87–5.11)
RDW: 15.3 % (ref 11.5–15.5)
WBC: 6.7 10*3/uL (ref 4.0–10.5)
nRBC: 0 % (ref 0.0–0.2)

## 2019-07-26 LAB — CBG MONITORING, ED: Glucose-Capillary: 81 mg/dL (ref 70–99)

## 2019-07-26 NOTE — ED Provider Notes (Signed)
[facility] DEPT Provider Note   CSN: [id number] Arrival date & time: 07/26/19  2103     History Chief Complaint  Patient presents with  . Seizures    Molly Murillo is a 49 y.o. female with a past medical history of alcohol abuse, several beers daily presenting to the ED with a chief complaint of seizure.  Boyfriend at bedside states that early this morning at 2 AM, patient had a tonic-clonic like seizure.  The episode lasted for about 1 minute, she then started walking out of the room and then fell.  Believes that the patient syncopized after this.  Patient denies history of seizures in the past.  Patient does not recall anything about the incident last night, last thing she remembers is going to her friend's house and then waking up this morning with her boyfriend asking her if she was okay.  States that all day she has felt generally achy, mentally foggy and fatigued.  She denies headache, numbness in arms or legs, vision changes, vomiting, chest pain, shortness of breath, fever, diarrhea, neck stiffness or sick contacts with similar symptoms.  Patient reports alcohol use daily and did take several shots of liquor yesterday.  She denies history of alcohol withdrawal seizures in the past.  States that she takes Xanax approximately once every other month "only when someone gives it to me."  Denies recent use.  HPI     History reviewed. No pertinent past medical history.  There are no problems to display for this patient.   History reviewed. No pertinent surgical history.   OB History   No obstetric history on file.     Family History  Problem Relation Age of Onset  . Diabetes Mother     Social History   Tobacco Use  . Smoking status: Current Every Day Smoker    Packs/day: 0.50    Types: Cigarettes  . Smokeless tobacco: Never Used  Substance Use Topics  . Alcohol use: Yes    Comment: occasional  . Drug use: No    Home Medications Prior  to Admission medications   Medication Sig Start Date End Date Taking? Authorizing Provider  Aspirin-Salicylamide-Caffeine (BC HEADACHE PO) Take 1 packet by mouth daily as needed (headache).   Yes [provider]  Elderberry 575 MG/5ML SYRP Take 5 mLs by mouth daily.   Yes [provider]  ciprofloxacin (CIPRO) 500 MG tablet Take 1 tablet (500 mg total) by mouth 2 (two) times daily. One po bid x 7 days Patient not taking: Reported on 07/26/2019 10/02/14   Elnora Morrison, MD  HYDROcodone-acetaminophen [facility]) 5-325 MG per tablet Take 1-2 tablets by mouth every 4 (four) hours as needed. Patient not taking: Reported on 07/26/2019 10/02/14   Elnora Morrison, MD  ibuprofen (ADVIL,MOTRIN) 600 MG tablet Take 1 tablet (600 mg total) by mouth every 6 (six) hours as needed. Patient not taking: Reported on 07/26/2019 02/23/17   Langston Masker B, PA-C  methocarbamol (ROBAXIN) 500 MG tablet Take 1 tablet (500 mg total) by mouth 2 (two) times daily. Patient not taking: Reported on 07/26/2019 02/23/17   Langston Masker B, PA-C  naproxen (NAPROSYN) 375 MG tablet Take 1 tablet (375 mg total) by mouth 2 (two) times daily as needed. Patient not taking: Reported on 07/26/2019 10/02/14   Elnora Morrison, MD  polyethylene glycol [facility] Floria Raveling) packet Take 17 g by mouth daily. Patient not taking: Reported on 07/26/2019 06/01/17   Delia Heady, PA-C  Allergies    Patient has no known allergies.  Review of Systems   Review of Systems  Constitutional: Positive for fatigue. Negative for appetite change, chills and fever.  HENT: Negative for ear pain, rhinorrhea, sneezing and sore throat.   Eyes: Negative for photophobia and visual disturbance.  Respiratory: Negative for cough, chest tightness, shortness of breath and wheezing.   Cardiovascular: Negative for chest pain and palpitations.  Gastrointestinal: Negative for abdominal pain, blood in stool, constipation, diarrhea, nausea and vomiting.  Genitourinary:  Negative for dysuria, hematuria and urgency.  Musculoskeletal: Positive for myalgias.  Skin: Negative for rash.  Neurological: Positive for seizures. Negative for dizziness, weakness and light-headedness.    Physical Exam Updated Vital Signs BP 98/69   Pulse 70   Temp 98.7 F (37.1 C) (Oral)   Resp 18   Ht 5\' 6"  (1.676 m)   Wt 69.4 kg   SpO2 99%   BMI 24.69 kg/m   Physical Exam Vitals and nursing note reviewed.  Constitutional:      General: She is not in acute distress.    Appearance: She is well-developed.     Comments: No tremors.  HENT:     Head: Normocephalic and atraumatic.     Nose: Nose normal.  Eyes:     General: No scleral icterus.       Right eye: No discharge.        Left eye: No discharge.     Conjunctiva/sclera: Conjunctivae normal.     Pupils: Pupils are equal, round, and reactive to light.  Cardiovascular:     Rate and Rhythm: Normal rate and regular rhythm.     Heart sounds: Normal heart sounds. No murmur. No friction rub. No gallop.   Pulmonary:     Effort: Pulmonary effort is normal. No respiratory distress.     Breath sounds: Normal breath sounds.  Abdominal:     General: Bowel sounds are normal. There is no distension.     Palpations: Abdomen is soft.     Tenderness: There is no abdominal tenderness. There is no guarding.  Musculoskeletal:        General: Normal range of motion.     Cervical back: Normal range of motion and neck supple.  Skin:    General: Skin is warm and dry.     Findings: No rash.  Neurological:     General: No focal deficit present.     Mental Status: She is alert and oriented to person, place, and time.     Cranial Nerves: No cranial nerve deficit.     Sensory: No sensory deficit.     Motor: No weakness or abnormal muscle tone.     Coordination: Coordination normal.     Comments: Pupils reactive. No facial asymmetry noted. Cranial nerves appear grossly intact. Sensation intact to light touch on face, BUE and BLE.  Strength 5/5 in BUE and BLE.      ED Results / Procedures / Treatments   Labs (all labs ordered are listed, but only abnormal results are displayed) Labs Reviewed  BASIC METABOLIC PANEL - Abnormal; Notable for the following components:      Result Value   BUN <5 (*)    Calcium 8.8 (*)    All other components within normal limits  CBC - Abnormal; Notable for the following components:   RBC 3.48 (*)    MCV 103.7 (*)    MCH 35.1 (*)    All other components within normal limits  RAPID URINE DRUG SCREEN, HOSP PERFORMED - Abnormal; Notable for the following components:   Cocaine POSITIVE (*)    Tetrahydrocannabinol POSITIVE (*)    All other components within normal limits  ETHANOL  CBG MONITORING, ED  I-STAT BETA HCG BLOOD, ED ([facility], [facility], [facility] ONLY)    EKG None  Radiology CT HEAD WO CONTRAST  Result Date: 07/26/2019 CLINICAL DATA:  Seizure, fatigue EXAM: CT HEAD WITHOUT CONTRAST TECHNIQUE: Contiguous axial images were obtained from the base of the skull through the vertex without intravenous contrast. COMPARISON:  None. FINDINGS: Brain: No acute infarct or hemorrhage. Lateral ventricles are unremarkable. There is abnormal fluid attenuation in the suprasellar region, measuring 3.5 x 2.4 x 2.3 cm. I do not see any associated soft tissue mass. There is a punctate 2 mm calcification along the anterior inferior margin of this region. No significant mass effect. There are no acute extra-axial fluid collections. Vascular: No hyperdense vessel or unexpected calcification. Skull: Normal. Negative for fracture or focal lesion. Sinuses/Orbits: No acute finding. Other: None. IMPRESSION: 1. No acute infarct or hemorrhage. 2. Prominent CSF attenuation in the suprasellar region, of uncertain etiology. This could reflect prominent CSF space, arachnoid cyst, or other cystic mass. Nonemergent MRI may be useful for follow-up. Electronically Signed   By: Sharlet Salina M.D.   On: 07/26/2019 22:58     Procedures Procedures (including critical care time)  Medications Ordered in ED Medications - No data to display  ED Course  I have reviewed the triage vital signs and the nursing notes.  Pertinent labs & imaging results that were available during my care of the patient were reviewed by me and considered in my medical decision making (see chart for details).    MDM Rules/Calculators/A&P                      49 year old female with a past medical history of alcohol abuse (several beers daily), presenting to the ED with a chief complaint of possible seizure.  Patient's boyfriend at bedside states that he witnessed patient having a shaking episode around 2 AM today which lasted about a minute.  She was unable to walk out of the room and described syncope after this.  He then took her to the bed, she went to sleep and then woke up this morning unable to remember the incident.  She has been feeling foggy, achy and fatigue since then.  She reports daily alcohol use, she did drink alcohol yesterday.  On exam there are no neurological deficits noted.  No meningeal signs.  No tremors, no concerning findings of DTs.  She is alert and oriented x4.  No postictal phase reported.  Lab work including CBC, BMP unremarkable.  Ethanol level is 0.  UDS positive for cocaine and THC.  CT of the head shows prominence of the CSF and suprasellar region, nonemergent MRI recommended.  Patient observed here for 4.5 hours with no recurrence of her seizures.  Patient does not appear to be in alcohol withdrawal.  I am unsure if this is actually a seizure as she does not appear to have any postictal phase. She remains stable here.  We will refer her to neurology, give seizure precautions and have her return for worsening symptoms. Patient informed of CT head findings. Case discussed with Dr. Eudelia Bunch.  All imaging, if done today, including plain films, CT scans, and ultrasounds, independently reviewed by me, and  interpretations confirmed via formal radiology reads.  Patient is hemodynamically  stable, in NAD, and [place] to ambulate in the ED. Evaluation does not show pathology that would require ongoing emergent intervention or inpatient treatment. I explained the diagnosis to the patient. Pain has been managed and has no complaints prior to discharge. Patient is comfortable with above plan and is stable for discharge at this time. All questions were answered prior to disposition. Strict return precautions for returning to the ED were discussed. Encouraged follow up with PCP.   An After Visit Summary was printed and given to the patient.   Portions of this note were generated with Scientist, clinical (histocompatibility and immunogenetics). Dictation errors may occur despite best attempts at proofreading.  Final Clinical Impression(s) / ED Diagnoses Final diagnoses:  Syncope, unspecified syncope type    Rx / DC Orders ED Discharge Orders         Ordered    Ambulatory referral to Neurology    Comments: An appointment is requested in approximately: 1 week   07/27/19 0143           Dietrich Pates, PA-C 07/27/19 0144    Nira Conn, MD 07/27/19 [phone]

## 2019-07-26 NOTE — ED Notes (Signed)
Patient transported to CT 

## 2019-07-26 NOTE — ED Notes (Signed)
Pt informed that we would like to collect a urine specimen. She will use the call bell when ready.

## 2019-07-26 NOTE — ED Triage Notes (Signed)
Pt reports that her boyfriend told her that she has a seizure last night. Pt denies seizure history. States that now she feels achy, mentally foggy, and tired. Denies unilateral weakness nor numbness.

## 2019-07-27 LAB — RAPID URINE DRUG SCREEN, HOSP PERFORMED
Amphetamines: NOT DETECTED
Barbiturates: NOT DETECTED
Benzodiazepines: NOT DETECTED
Cocaine: POSITIVE — AB
Opiates: NOT DETECTED
Tetrahydrocannabinol: POSITIVE — AB

## 2019-07-27 LAB — ETHANOL: Alcohol, Ethyl (B): <10 mg/dL (ref <10)

## 2019-07-27 NOTE — Discharge Instructions (Signed)
Do not drive, swim, or do any other activities that would be dangerous if you had another seizure. Wait until you are cleared by the neurologist. Return to the ER for any additional seizures, severe headache or neck stiffness, numbness in arms or legs.

## 2019-08-17 ENCOUNTER — Ambulatory Visit: Payer: Self-pay | Admitting: Neurology

## 2019-09-06 ENCOUNTER — Emergency Department (HOSPITAL_COMMUNITY)
Admission: EM | Admit: 2019-09-06 | Discharge: 2019-09-06 | Disposition: A | Discharge status: Home / Self Care | Payer: Self-pay | Attending: Emergency Medicine | Admitting: Emergency Medicine

## 2019-09-06 ENCOUNTER — Other Ambulatory Visit: Payer: Self-pay

## 2019-09-06 ENCOUNTER — Encounter (HOSPITAL_COMMUNITY): Payer: Self-pay

## 2019-09-06 DIAGNOSIS — F17210 Nicotine dependence, cigarettes, uncomplicated: Secondary | ICD-10-CM | POA: Insufficient documentation

## 2019-09-06 DIAGNOSIS — T40601A Poisoning by unspecified narcotics, accidental (unintentional), initial encounter: Secondary | ICD-10-CM | POA: Insufficient documentation

## 2019-09-06 LAB — RAPID URINE DRUG SCREEN, HOSP PERFORMED
Amphetamines: NOT DETECTED
Barbiturates: NOT DETECTED
Benzodiazepines: POSITIVE — AB
Cocaine: POSITIVE — AB
Opiates: NOT DETECTED
Tetrahydrocannabinol: POSITIVE — AB

## 2019-09-06 LAB — CBC WITH DIFFERENTIAL/PLATELET
Abs Immature Granulocytes: 0.03 10*3/uL (ref 0.00–0.07)
Basophils Absolute: 0 10*3/uL (ref 0.0–0.1)
Basophils Relative: 1 %
Eosinophils Absolute: 0 10*3/uL (ref 0.0–0.5)
Eosinophils Relative: 0 %
HCT: 35.6 % — ABNORMAL LOW (ref 36.0–46.0)
Hemoglobin: 12 g/dL (ref 12.0–15.0)
Immature Granulocytes: 0 %
Lymphocytes Relative: 10 %
Lymphs Abs: 0.7 10*3/uL (ref 0.7–4.0)
MCH: 34.7 pg — ABNORMAL HIGH (ref 26.0–34.0)
MCHC: 33.7 g/dL (ref 30.0–36.0)
MCV: 102.9 fL — ABNORMAL HIGH (ref 80.0–100.0)
Monocytes Absolute: 0.8 10*3/uL (ref 0.1–1.0)
Monocytes Relative: 11 %
Neutro Abs: 6 10*3/uL (ref 1.7–7.7)
Neutrophils Relative %: 78 %
Platelets: 222 10*3/uL (ref 150–400)
RBC: 3.46 MIL/uL — ABNORMAL LOW (ref 3.87–5.11)
RDW: 16.4 % — ABNORMAL HIGH (ref 11.5–15.5)
WBC: 7.6 10*3/uL (ref 4.0–10.5)
nRBC: 0 % (ref 0.0–0.2)

## 2019-09-06 LAB — COMPREHENSIVE METABOLIC PANEL
ALT: 40 U/L (ref 0–44)
AST: 62 U/L — ABNORMAL HIGH (ref 15–41)
Albumin: 4.4 g/dL (ref 3.5–5.0)
Alkaline Phosphatase: 75 U/L (ref 38–126)
Anion gap: 11 (ref 5–15)
BUN: 9 mg/dL (ref 6–20)
CO2: 25 mmol/L (ref 22–32)
Calcium: 9 mg/dL (ref 8.9–10.3)
Chloride: 107 mmol/L (ref 98–111)
Creatinine, Ser: 0.74 mg/dL (ref 0.44–1.00)
GFR calc Af Amer: >60 mL/min (ref >60)
GFR calc non Af Amer: >60 mL/min (ref >60)
Glucose, Bld: 100 mg/dL — ABNORMAL HIGH (ref 70–99)
Potassium: 3.9 mmol/L (ref 3.5–5.1)
Sodium: 143 mmol/L (ref 135–145)
Total Bilirubin: 0.3 mg/dL (ref 0.3–1.2)
Total Protein: 7.6 g/dL (ref 6.5–8.1)

## 2019-09-06 LAB — URINALYSIS, ROUTINE W REFLEX MICROSCOPIC
Bilirubin Urine: NEGATIVE
Glucose, UA: NEGATIVE mg/dL
Hgb urine dipstick: NEGATIVE
Ketones, ur: NEGATIVE mg/dL
Nitrite: NEGATIVE
Protein, ur: NEGATIVE mg/dL
Specific Gravity, Urine: 1.017 (ref 1.005–1.030)
pH: 5 (ref 5.0–8.0)

## 2019-09-06 LAB — PREGNANCY, URINE: Preg Test, Ur: NEGATIVE

## 2019-09-06 NOTE — ED Notes (Addendum)
Pt brought in by EMS after unresponsive at home   Responded to narcan and became alert   Denies using drugs but reported taking friends narcotic pain med last night and this am   She is lethargic and answers questions slowly  Awaiting eval

## 2019-09-06 NOTE — ED Triage Notes (Signed)
EMS reports family called reports pt was vomiting and unresponsive.  WHen ems arrived rr was 8, o2 sat 60's, pupils 38mm and equal.  EMS gave 4mg  narcan intranasally and put pt on NRB.  Reports pt then came around, opened eyes, and started talking to ems.  Reports has been under a lot of stress recently.  Pt 100% on room air, cbg 184.  EMS started 20g IV in left ac.  BP 126/82.  EMS says pt has history of seizures.  Pt says she felt ok when she got up this morning but after eating breafast she says she felt like everything was slow.   Pt says she started losing her balance.  Says symptoms started aroun 1000 or 1030.  Pt says she took a vicodin that belonged to her friend last night and this morning.  Pt says she felt ok before taking the vicodin this morning other than her back hurting.  Pt also says she drink 16-24 oz of beer.  Denies taking other drugs.

## 2019-09-06 NOTE — ED Provider Notes (Signed)
[facility] EMERGENCY DEPARTMENT Provider Note   CSN: [id number] Arrival date & time: 09/06/19  1731     History No chief complaint on file.   Molly Murillo is a 49 y.o. female.  HPI Patient evaluated by me at 6:08 PM.  I was not informed of her presence, previously.  Patient is currently resting with eyes closed, opens them immediately when I said hello.  She states that she took 2 Vicodin today and a Xanax yesterday, because she is under stress.  These are not her medications.  She denies use of other narcotics, legal or illegal.  She reports stress with friends, family and life in general.  She denies recent illnesses including fever, cough, chest pain, nausea, vomiting, weakness or dizziness.  There are no other known modifying factors.    History reviewed. No pertinent past medical history.  There are no problems to display for this patient.   Past Surgical History:  Procedure Laterality Date  . LEEP       OB History   No obstetric history on file.     Family History  Problem Relation Age of Onset  . Diabetes Mother     Social History   Tobacco Use  . Smoking status: Current Every Day Smoker    Packs/day: 0.50    Types: Cigarettes  . Smokeless tobacco: Never Used  Vaping Use  . Vaping Use: Former  Substance Use Topics  . Alcohol use: Yes    Comment: 1-2 beers per day  . Drug use: No    Home Medications Prior to Admission medications   Medication Sig Start Date End Date Taking? Authorizing Provider  Aspirin-Acetaminophen-Caffeine (GOODY HEADACHE PO) Take 1-2 packets by mouth daily as needed (for pain).   Yes [provider]    Allergies    Patient has no known allergies.  Review of Systems   Review of Systems  All other systems reviewed and are negative.   Physical Exam Updated Vital Signs BP (!) 138/95   Pulse 93   Temp 97.6 F (36.4 C) (Oral)   Resp 14   LMP 08/16/2019   SpO2 98%   Physical Exam Vitals and nursing note  reviewed.  Constitutional:      Appearance: She is well-developed.  HENT:     Head: Normocephalic and atraumatic.     Right Ear: External ear normal.     Left Ear: External ear normal.  Eyes:     Conjunctiva/sclera: Conjunctivae normal.     Pupils: Pupils are equal, round, and reactive to light.     Comments: Pupils 2 mm bilaterally and reactive.  Neck:     Trachea: Phonation normal.  Cardiovascular:     Rate and Rhythm: Normal rate.  Pulmonary:     Effort: Pulmonary effort is normal.  Abdominal:     General: There is no distension.  Musculoskeletal:        General: Normal range of motion.     Cervical back: Normal range of motion and neck supple.  Skin:    General: Skin is warm and dry.  Neurological:     Mental Status: She is alert and oriented to person, place, and time.     Cranial Nerves: No cranial nerve deficit.     Sensory: No sensory deficit.     Motor: No abnormal muscle tone.     Coordination: Coordination normal.     Comments: Lucid  Psychiatric:        Mood and  Affect: Mood normal.        Behavior: Behavior normal.        Thought Content: Thought content normal.        Judgment: Judgment normal.     ED Results / Procedures / Treatments   Labs (all labs ordered are listed, but only abnormal results are displayed) Labs Reviewed  CBC WITH DIFFERENTIAL/PLATELET - Abnormal; Notable for the following components:      Result Value   RBC 3.46 (*)    HCT 35.6 (*)    MCV 102.9 (*)    MCH 34.7 (*)    RDW 16.4 (*)    All other components within normal limits  COMPREHENSIVE METABOLIC PANEL - Abnormal; Notable for the following components:   Glucose, Bld 100 (*)    AST 62 (*)    All other components within normal limits  URINALYSIS, ROUTINE W REFLEX MICROSCOPIC - Abnormal; Notable for the following components:   APPearance HAZY (*)    Leukocytes,Ua TRACE (*)    Bacteria, UA RARE (*)    All other components within normal limits  RAPID URINE DRUG SCREEN, HOSP  PERFORMED - Abnormal; Notable for the following components:   Cocaine POSITIVE (*)    Benzodiazepines POSITIVE (*)    Tetrahydrocannabinol POSITIVE (*)    All other components within normal limits  PREGNANCY, URINE    EKG EKG Interpretation  Date/Time:  Sunday September 06 2019 17:48:54 EDT Ventricular Rate:  87 PR Interval:    QRS Duration: 86 QT Interval:  389 QTC Calculation: 468 R Axis:   83 Text Interpretation: Sinus rhythm No old tracing to compare Confirmed by Mancel Bale [phone]) on 09/06/2019 6:34:47 PM   Radiology No results found.  Procedures Procedures (including critical care time)  Medications Ordered in ED Medications - No data to display  ED Course  I have reviewed the triage vital signs and the nursing notes.  Pertinent labs & imaging results that were available during my care of the patient were reviewed by me and considered in my medical decision making (see chart for details).  Clinical Course as of Sep 06 2015  Wynelle Link Sep 06, 2019  2683 She is lucid on arrival after treatment by EMS with Narcan.  Pupils remain small, indicating ongoing intoxication with narcotic.   [EW]  1824 Plan is to observe patient for about an hour, check some labs and reassess.   [EW]  2009 Abnormal, presence of cocaine, benzodiazepines and tetrahydrocannabinol  Rapid urine drug screen (hospital performed)(!) [EW]  2009 Normal except glucose minimally elevated.   [EW]  2009 Negative  Pregnancy, urine [EW]  2009 Normal except presence of leukocytes, white cells and rare bacteria  Urinalysis, Routine w reflex microscopic(!) [EW]  2009 Normal except hematocrit slightly low, RBC indices are abnormal.  CBC with Differential(!) [EW]  2010 WBC, UA: 11-20 [EW]    Clinical Course User Index [EW] Mancel Bale, MD   MDM Rules/Calculators/A&P                           Patient Vitals for the past 24 hrs:  BP Temp Temp src Pulse Resp SpO2  09/06/19 1929 -- -- -- 93 14 98 %   09/06/19 1901 (!) 138/95 -- -- 87 10 100 %  09/06/19 1846 121/90 -- -- 81 (!) 24 98 %  09/06/19 1820 -- -- -- 84 16 98 %  09/06/19 1750 (!) 141/93 97.6 F (36.4 C) Oral  86 17 99 %    8:10 PM Reevaluation with update and discussion. After initial assessment and treatment, an updated evaluation reveals she remains alert and cooperative comfortable, oxygen saturation 98% on room air.  Vital signs are reassuring.  Findings discussed with the patient and all questions were answered. Mancel Bale   Medical Decision Making:  This patient is presenting for evaluation of suspected narcotic overdose, Vicodin, which does require a range of treatment options, and is a complaint that involves a moderate risk of morbidity and mortality. The differential diagnoses include polysubstance overdose, nonspecific encephalopathy. I decided to review old records, and in summary healthy middle-aged female presenting with suspected narcotic overdose, and somewhat elevated blood pressure.  Alert on arrival after Narcan recovery..  I did not require additional historical information from anyone.  Clinical Laboratory Tests Ordered, included CBC, Metabolic panel, Urinalysis and Urine drug screen. Review indicates normal findings except substance of abuse in the urine.    Cardiac Monitor Tracing which shows normal sinus rhythm    Critical Interventions-clinical evaluation, EKG, laboratory testing, observation reassessment.  After These Interventions, the Patient was reevaluated and was found stable for discharge.  Apparent Narcan recovery, not requiring retreatment.  Patient reported that she took some hydrocodone that a friend of hers gave her.  Drug screen positive for cocaine as well.  No prolonged altered mental status, or requirement for hospitalization at this time  CRITICAL CARE-no Performed by: Mancel Bale  Nursing Notes Reviewed/ Care Coordinated Applicable Imaging Reviewed Interpretation of  Laboratory Data incorporated into ED treatment  The patient appears reasonably screened and/or stabilized for discharge and I doubt any other medical condition or other [facility] requiring further screening, evaluation, or treatment in the ED at this time prior to discharge.  Plan: Home Medications-usual OTC as needed; Home Treatments-avoid illegal substances and sharing medications; return here if the recommended treatment, does not improve the symptoms; Recommended follow up-PCP of choice as needed     Final Clinical Impression(s) / ED Diagnoses Final diagnoses:  Narcotic overdose, accidental or unintentional, initial encounter [facility])    Rx / DC Orders ED Discharge Orders    None       Mancel Bale, MD 09/06/19 2017

## 2019-09-06 NOTE — Discharge Instructions (Addendum)
You were treated with Narcan, because of suspected narcotic overdose.  This seemed to help you wake up, which tends to indicate that the hydrocodone that you took, was making you sleepy.  There was some cocaine in her system as well.  It is important to not use drugs which are not prescribed for you, and also to avoid illegal drugs.  Make sure you are getting plenty of rest, drinking a lot of fluids and follow-up with a doctor if you are not feeling better in a few days.

## 2019-10-12 LAB — HM COLONOSCOPY

## 2019-12-02 ENCOUNTER — Other Ambulatory Visit: Payer: Self-pay

## 2019-12-02 ENCOUNTER — Encounter (HOSPITAL_COMMUNITY): Payer: Self-pay

## 2019-12-02 ENCOUNTER — Emergency Department (HOSPITAL_COMMUNITY): Payer: 59

## 2019-12-02 ENCOUNTER — Observation Stay (HOSPITAL_COMMUNITY)
Admission: EM | Admit: 2019-12-02 | Discharge: 2019-12-04 | Disposition: A | Discharge status: Home / Self Care | Payer: 59 | Attending: Internal Medicine | Admitting: Internal Medicine

## 2019-12-02 DIAGNOSIS — M542 Cervicalgia: Secondary | ICD-10-CM

## 2019-12-02 DIAGNOSIS — F17210 Nicotine dependence, cigarettes, uncomplicated: Secondary | ICD-10-CM | POA: Diagnosis not present

## 2019-12-02 DIAGNOSIS — S01512A Laceration without foreign body of oral cavity, initial encounter: Secondary | ICD-10-CM | POA: Diagnosis not present

## 2019-12-02 DIAGNOSIS — F1910 Other psychoactive substance abuse, uncomplicated: Secondary | ICD-10-CM | POA: Diagnosis not present

## 2019-12-02 DIAGNOSIS — X58XXXA Exposure to other specified factors, initial encounter: Secondary | ICD-10-CM | POA: Diagnosis not present

## 2019-12-02 DIAGNOSIS — Z20822 Contact with and (suspected) exposure to covid-19: Secondary | ICD-10-CM | POA: Diagnosis not present

## 2019-12-02 DIAGNOSIS — Z7982 Long term (current) use of aspirin: Secondary | ICD-10-CM | POA: Diagnosis not present

## 2019-12-02 DIAGNOSIS — R569 Unspecified convulsions: Secondary | ICD-10-CM | POA: Diagnosis not present

## 2019-12-02 DIAGNOSIS — Z720 Tobacco use: Secondary | ICD-10-CM | POA: Diagnosis present

## 2019-12-02 DIAGNOSIS — G930 Cerebral cysts: Secondary | ICD-10-CM | POA: Diagnosis not present

## 2019-12-02 DIAGNOSIS — E876 Hypokalemia: Secondary | ICD-10-CM | POA: Diagnosis not present

## 2019-12-02 HISTORY — DX: Syncope and collapse: R55

## 2019-12-02 HISTORY — DX: Poisoning by unspecified narcotics, accidental (unintentional), initial encounter: T40.601A

## 2019-12-02 LAB — BASIC METABOLIC PANEL
Anion gap: 9 (ref 5–15)
BUN: 8 mg/dL (ref 6–20)
CO2: 24 mmol/L (ref 22–32)
Calcium: 8.8 mg/dL — ABNORMAL LOW (ref 8.9–10.3)
Chloride: 109 mmol/L (ref 98–111)
Creatinine, Ser: 0.83 mg/dL (ref 0.44–1.00)
GFR, Estimated: >60 mL/min (ref >60)
Glucose, Bld: 103 mg/dL — ABNORMAL HIGH (ref 70–99)
Potassium: 4 mmol/L (ref 3.5–5.1)
Sodium: 142 mmol/L (ref 135–145)

## 2019-12-02 LAB — RAPID URINE DRUG SCREEN, HOSP PERFORMED
Amphetamines: NOT DETECTED
Barbiturates: NOT DETECTED
Benzodiazepines: POSITIVE — AB
Cocaine: POSITIVE — AB
Opiates: NOT DETECTED
Tetrahydrocannabinol: POSITIVE — AB

## 2019-12-02 LAB — CBC WITH DIFFERENTIAL/PLATELET
Abs Immature Granulocytes: 0.07 10*3/uL (ref 0.00–0.07)
Basophils Absolute: 0.1 10*3/uL (ref 0.0–0.1)
Basophils Relative: 1 %
Eosinophils Absolute: 0 10*3/uL (ref 0.0–0.5)
Eosinophils Relative: 0 %
HCT: 34.7 % — ABNORMAL LOW (ref 36.0–46.0)
Hemoglobin: 11.6 g/dL — ABNORMAL LOW (ref 12.0–15.0)
Immature Granulocytes: 1 %
Lymphocytes Relative: 5 %
Lymphs Abs: 0.6 10*3/uL — ABNORMAL LOW (ref 0.7–4.0)
MCH: 33.7 pg (ref 26.0–34.0)
MCHC: 33.4 g/dL (ref 30.0–36.0)
MCV: 100.9 fL — ABNORMAL HIGH (ref 80.0–100.0)
Monocytes Absolute: 0.9 10*3/uL (ref 0.1–1.0)
Monocytes Relative: 8 %
Neutro Abs: 9.2 10*3/uL — ABNORMAL HIGH (ref 1.7–7.7)
Neutrophils Relative %: 85 %
Platelets: 185 10*3/uL (ref 150–400)
RBC: 3.44 MIL/uL — ABNORMAL LOW (ref 3.87–5.11)
RDW: 16.2 % — ABNORMAL HIGH (ref 11.5–15.5)
WBC: 10.8 10*3/uL — ABNORMAL HIGH (ref 4.0–10.5)
nRBC: 0 % (ref 0.0–0.2)

## 2019-12-02 LAB — RESPIRATORY PANEL BY RT PCR (FLU A&B, COVID)
Influenza A by PCR: NEGATIVE
Influenza B by PCR: NEGATIVE
SARS Coronavirus 2 by RT PCR: NEGATIVE

## 2019-12-02 LAB — ETHANOL: Alcohol, Ethyl (B): <10 mg/dL (ref <10)

## 2019-12-02 LAB — SALICYLATE LEVEL: Salicylate Lvl: <7 mg/dL — ABNORMAL LOW (ref 7.0–30.0)

## 2019-12-02 LAB — CBG MONITORING, ED: Glucose-Capillary: 116 mg/dL — ABNORMAL HIGH (ref 70–99)

## 2019-12-02 LAB — POC URINE PREG, ED: Preg Test, Ur: NEGATIVE

## 2019-12-02 LAB — ACETAMINOPHEN LEVEL: Acetaminophen (Tylenol), Serum: <10 ug/mL — ABNORMAL LOW (ref 10–30)

## 2019-12-02 MED ORDER — LEVETIRACETAM IN NACL 1000 MG/100ML IV SOLN
1000.0000 mg | Freq: Once | INTRAVENOUS | Status: AC
  Administered 2019-12-02: 1000 mg via INTRAVENOUS
  Filled 2019-12-02: qty 100

## 2019-12-02 MED ORDER — ACETAMINOPHEN 325 MG PO TABS
650.0000 mg | ORAL_TABLET | Freq: Four times a day (QID) | ORAL | Status: DC | PRN
  Administered 2019-12-03 (×2): 650 mg via ORAL
  Filled 2019-12-02 (×2): qty 2

## 2019-12-02 MED ORDER — LORAZEPAM 1 MG PO TABS: 1.0000 mg | ORAL_TABLET | ORAL | Status: DC | PRN

## 2019-12-02 MED ORDER — THIAMINE HCL 100 MG PO TABS
100.0000 mg | ORAL_TABLET | Freq: Every day | ORAL | Status: DC
  Administered 2019-12-03 – 2019-12-04 (×2): 100 mg via ORAL
  Filled 2019-12-02 (×2): qty 1

## 2019-12-02 MED ORDER — ACETAMINOPHEN 650 MG RE SUPP: 650.0000 mg | Freq: Four times a day (QID) | RECTAL | Status: DC | PRN

## 2019-12-02 MED ORDER — LIDOCAINE VISCOUS HCL 2 % MT SOLN
15.0000 mL | Freq: Once | OROMUCOSAL | Status: AC
  Administered 2019-12-02: 15 mL via OROMUCOSAL
  Filled 2019-12-02: qty 15

## 2019-12-02 MED ORDER — ADULT MULTIVITAMIN W/MINERALS CH
1.0000 | ORAL_TABLET | Freq: Every day | ORAL | Status: DC
  Administered 2019-12-02 – 2019-12-04 (×2): 1 via ORAL
  Filled 2019-12-02 (×2): qty 1

## 2019-12-02 MED ORDER — BUTALBITAL-APAP-CAFFEINE 50-325-40 MG PO TABS
1.0000 | ORAL_TABLET | Freq: Four times a day (QID) | ORAL | Status: DC | PRN
  Administered 2019-12-02: 1 via ORAL
  Filled 2019-12-02: qty 1

## 2019-12-02 MED ORDER — LORAZEPAM 2 MG/ML IJ SOLN: 1.0000 mg | INTRAMUSCULAR | Status: DC | PRN

## 2019-12-02 MED ORDER — KETOROLAC TROMETHAMINE 30 MG/ML IJ SOLN
30.0000 mg | Freq: Once | INTRAMUSCULAR | Status: AC
  Administered 2019-12-02: 30 mg via INTRAVENOUS
  Filled 2019-12-02: qty 1

## 2019-12-02 MED ORDER — FOLIC ACID 1 MG PO TABS
1.0000 mg | ORAL_TABLET | Freq: Every day | ORAL | Status: DC
  Administered 2019-12-03 – 2019-12-04 (×2): 1 mg via ORAL
  Filled 2019-12-02 (×2): qty 1

## 2019-12-02 MED ORDER — ACETAMINOPHEN 325 MG PO TABS
650.0000 mg | ORAL_TABLET | Freq: Once | ORAL | Status: AC
  Administered 2019-12-02: 650 mg via ORAL
  Filled 2019-12-02: qty 2

## 2019-12-02 MED ORDER — LACTATED RINGERS IV BOLUS
1000.0000 mL | Freq: Once | INTRAVENOUS | Status: AC
  Administered 2019-12-02: 1000 mL via INTRAVENOUS

## 2019-12-02 MED ORDER — LIDOCAINE HCL (PF) 2 % IJ SOLN
5.0000 mL | Freq: Once | INTRAMUSCULAR | Status: AC
  Administered 2019-12-02: 5 mL via INTRADERMAL
  Filled 2019-12-02: qty 10

## 2019-12-02 MED ORDER — LEVETIRACETAM 500 MG PO TABS
500.0000 mg | ORAL_TABLET | Freq: Two times a day (BID) | ORAL | Status: DC
  Administered 2019-12-02 – 2019-12-04 (×4): 500 mg via ORAL
  Filled 2019-12-02 (×4): qty 1

## 2019-12-02 MED ORDER — THIAMINE HCL 100 MG/ML IJ SOLN: 100.0000 mg | Freq: Every day | INTRAMUSCULAR | Status: DC

## 2019-12-02 MED ORDER — MAGIC MOUTHWASH W/LIDOCAINE
5.0000 mL | Freq: Four times a day (QID) | ORAL | Status: DC | PRN
  Administered 2019-12-02 – 2019-12-04 (×4): 5 mL via ORAL
  Filled 2019-12-02 (×4): qty 5

## 2019-12-02 MED ORDER — GADOBUTROL 1 MMOL/ML IV SOLN
7.0000 mL | Freq: Once | INTRAVENOUS | Status: AC | PRN
  Administered 2019-12-02: 7 mL via INTRAVENOUS

## 2019-12-02 MED ORDER — PANTOPRAZOLE SODIUM 40 MG PO TBEC
40.0000 mg | DELAYED_RELEASE_TABLET | Freq: Every day | ORAL | Status: DC
  Administered 2019-12-03 – 2019-12-04 (×2): 40 mg via ORAL
  Filled 2019-12-02 (×2): qty 1

## 2019-12-02 MED ORDER — HYDROCODONE-ACETAMINOPHEN 5-325 MG PO TABS
1.0000 | ORAL_TABLET | ORAL | Status: DC | PRN
  Administered 2019-12-03: 1 via ORAL
  Filled 2019-12-02: qty 1

## 2019-12-02 NOTE — ED Triage Notes (Signed)
Per boyfriend reported that pt had 4 seizures in her sleep and had 2 seizures in presence of EMS last approx 45 sec .Full body seizure  Administered 2.5 versed. Pt bit her tongue and bleeding

## 2019-12-02 NOTE — ED Notes (Signed)
Pt given bag of ice for tongue pain

## 2019-12-02 NOTE — ED Provider Notes (Signed)
[facility] EMERGENCY DEPARTMENT Provider Note   CSN: [id number] Arrival date & time: 12/02/19  0932     History Chief Complaint  Patient presents with  . Seizures    Molly Murillo is a 49 y.o. female.  HPI      Molly Murillo is a 49 y.o. female who presents to the Emergency Department via EMS with complaint of seizures.  Patient's boyfriend states that he witnessed for consecutive episodes of generalized shaking earlier this morning.  He states each episode was brief lasting approximately 1 minute or so.  EMS was contacted and witnessed to brief episodes lasting 45 seconds.  EMS described generalized tonic-clonic type activity.  Patient was given 2.5 mg of Versed prior to arrival.  On arrival, patient appears somnolent unable to provide significant history.  As several lacerations of her tongue.  Patient does state that she felt fine prior to going to bed last evening, does not recall any events other than EMS picking her up.  She does admit to drinking alcohol and smoking marijuana, but denies any other illicit drug use.  Patient's boyfriend states that she was seen in July for an episode of syncope.  Patient denies history of seizures.    Past Medical History:  Diagnosis Date  . Narcotic overdose (HCC)   . Syncope     There are no problems to display for this patient.   Past Surgical History:  Procedure Laterality Date  . LEEP       OB History   No obstetric history on file.     Family History  Problem Relation Age of Onset  . Diabetes Mother     Social History   Tobacco Use  . Smoking status: Current Every Day Smoker    Packs/day: 0.50    Types: Cigarettes  . Smokeless tobacco: Never Used  Vaping Use  . Vaping Use: Former  Substance Use Topics  . Alcohol use: Yes    Comment: 1-2 beers per day  . Drug use: No    Home Medications Prior to Admission medications   Medication Sig Start Date End Date Taking? Authorizing Provider    Aspirin-Acetaminophen-Caffeine (GOODY HEADACHE PO) Take 1-2 packets by mouth daily as needed (for pain).    [provider]  omeprazole (PRILOSEC) 40 MG capsule Take 40 mg by mouth daily. 11/13/19   [provider]    Allergies    Patient has no known allergies.  Review of Systems   Review of Systems  Unable to perform ROS: Mental status change    Physical Exam Updated Vital Signs BP (!) 160/93   Pulse (!) 106   Temp 98.8 F (37.1 C)   Resp 15   SpO2 97%   Physical Exam Vitals and nursing note reviewed.  Constitutional:      Appearance: She is not toxic-appearing.     Comments: Patient somnolent on arrival  HENT:     Head: Atraumatic.     Mouth/Throat:     Mouth: Mucous membranes are moist.     Comments: Multiple small lacerations of the tongue with a 2 cm  laceration to the lateral right tongue.  Minimal bleeding.  Lateral tongue appears macerated. Eyes:     Conjunctiva/sclera: Conjunctivae normal.     Comments: Pupils are equal and sluggish  Cardiovascular:     Rate and Rhythm: Normal rate and regular rhythm.     Pulses: Normal pulses.  Pulmonary:     Effort: Pulmonary effort is  normal.     Breath sounds: Normal breath sounds.  Abdominal:     Palpations: Abdomen is soft.     Tenderness: There is no abdominal tenderness.  Musculoskeletal:     Cervical back: Normal range of motion. No tenderness.  Skin:    General: Skin is warm.     Capillary Refill: Capillary refill takes less than 2 seconds.     Findings: No rash.  Neurological:     Comments: Patient appears post ictal.  Follows commands.  No focal extremity weakness or facial droop     ED Results / Procedures / Treatments   Labs (all labs ordered are listed, but only abnormal results are displayed) Labs Reviewed  BASIC METABOLIC PANEL - Abnormal; Notable for the following components:      Result Value   Glucose, Bld 103 (*)    Calcium 8.8 (*)    All other components within normal  limits  CBC WITH DIFFERENTIAL/PLATELET - Abnormal; Notable for the following components:   WBC 10.8 (*)    RBC 3.44 (*)    Hemoglobin 11.6 (*)    HCT 34.7 (*)    MCV 100.9 (*)    RDW 16.2 (*)    Neutro Abs 9.2 (*)    Lymphs Abs 0.6 (*)    All other components within normal limits  SALICYLATE LEVEL - Abnormal; Notable for the following components:   Salicylate Lvl <7.0 (*)    All other components within normal limits  ACETAMINOPHEN LEVEL - Abnormal; Notable for the following components:   Acetaminophen (Tylenol), Serum <10 (*)    All other components within normal limits  RAPID URINE DRUG SCREEN, HOSP PERFORMED - Abnormal; Notable for the following components:   Cocaine POSITIVE (*)    Benzodiazepines POSITIVE (*)    Tetrahydrocannabinol POSITIVE (*)    All other components within normal limits  CBG MONITORING, ED - Abnormal; Notable for the following components:   Glucose-Capillary 116 (*)    All other components within normal limits  RESPIRATORY PANEL BY RT PCR (FLU A&B, COVID)  ETHANOL  POC URINE PREG, ED    EKG None  Radiology CT HEAD WO CONTRAST  Result Date: 12/02/2019 CLINICAL DATA:  Seizure EXAM: CT HEAD WITHOUT CONTRAST TECHNIQUE: Contiguous axial images were obtained from the base of the skull through the vertex without intravenous contrast. COMPARISON:  07/26/2019 FINDINGS: Brain: Redemonstration of a CSF attenuation structure within the suprasellar region measuring approximately 3.4 x 2.2 x 2.2 cm, which is unchanged in size and appearance compared to the prior CT 07/26/2019. No evidence of acute infarction. No intracranial hemorrhage. No extra-axial collection. No hydrocephalus. Vascular: No hyperdense vessel or unexpected calcification. Skull: Normal. Negative for fracture or focal lesion. Sinuses/Orbits: No acute finding. Other: None. IMPRESSION: 1. No acute intracranial findings. 2. Stable CSF attenuation structure within the suprasellar region, which may represent  an arachnoid cyst or other cystic mass. Nonemergent contrast enhanced MRI of the brain is recommended for further characterization, if not recently performed elsewhere. Electronically Signed   By: Duanne Guess D.O.   On: 12/02/2019 13:03    Procedures Procedures (including critical care time)  LACERATION REPAIR Performed by: Annalyn Blecher Authorized by: Tamy Accardo Consent: Verbal consent obtained. Risks and benefits: risks, benefits and alternatives were discussed Consent given by: patient Patient identity confirmed: provided demographic data Prepped and Draped in normal sterile fashion Wound explored  Laceration Location: right tongue  Laceration Length: 2. 5 cm  No Foreign Bodies seen or  palpated  Anesthesia: topical and local infiltration  Local anesthetic: Viscous lidocaine and lidocaine 2 % w/o epinephrine  Anesthetic total: 3 and 2  Ml respectively  Irrigation method: syringe Amount of cleaning: standard  Skin closure: 5-0 vicryl  Number of sutures: 4  Technique: Simple interrupted  Patient tolerance: Patient tolerated the procedure well with no immediate complications.    Medications Ordered in ED Medications - No data to display  ED Course  I have reviewed the triage vital signs and the nursing notes.  Pertinent labs & imaging results that were available during my care of the patient were reviewed by me and considered in my medical decision making (see chart for details).    MDM Rules/Calculators/A&P                         On review of patient's medical record, she was seen in June of this year for a syncopal episode and again in July for narcotic overdose.  Patient denies history of seizures.  On arrival, patient does appear postictal but this may also be related to patient being given Versed by EMS.  Two brief episodes of tonic-clonic type activity witnessed by EMS prior to arrival.   Spoke with patient's significant other, Shelbie Proctor, ph#  [phone] who verified witnessing 4 brief episodes of "shaking and jerking all over" early this morning.   Labs reviewed by me, her blood alcohol level is less than 10, salicylate and Tylenol levels are reassuring.  No significant electrolyte abnormalities.  No leukocytosis.  Urine is without evidence for infection.  Her urine drug screen does show positive for cocaine, benzodiazepines, and marijuana.  CT scan of the head is without acute intracranial findings.   On recheck, patient is resting comfortably.  No seizure activity during ER stay.  Sleeping but easily aroused.  She is now more alert, appears to be mentating well.  No focal neuro deficits on exam.  Will consult neurology for what is likely a new onset seizures.  Consulted neurology, Dr. Gerilyn Pilgrim and discussed findings.  He recommends admission and he will see pt for consult and EEG.  Requested MRI brain  1640  Consulted hospitalist, Dr. Randol Kern and discussed findings,  He agrees to admit.     Final Clinical Impression(s) / ED Diagnoses Final diagnoses:  Seizure [facility])    Rx / DC Orders ED Discharge Orders    None       Pauline Aus, PA-C 12/02/19 1646    Donnetta Hutching, MD 12/04/19 1443

## 2019-12-02 NOTE — H&P (Signed)
[facility] H&P   Patient Demographics:    Molly Murillo, is a 49 y.o. female  MRN: [id number]   DOB - 1971/01/25  Admit Date - 12/02/2019  Outpatient Primary MD for the patient is Patient, No Pcp Per  Referring MD/NP/PA: PA Triplett  Patient coming from: Home  Chief Complaint  Patient presents with  . Seizures      HPI:    Molly Murillo  is a 49 y.o. female, without significant past medical history, she presents to ED secondary to complaints of seizures, boyfriend at bedside provide for the history, patient had witnessed seizures, she had 2 consecutive episodes earlier this morning, of tonic-clonic seizures, each episode lasting about 1 minute, as well she had another 2 episodes witnessed by EMS, lasting about 45 seconds each, she did receive 2.5 mg of Versed by EMS prior to arrival, patient had tongue biting/tongue laceration, no history of alcohol abuse, polysubstance abuse (she tested positive for cocaine and THC, but she denies cocaine use!!),  Report yesterday she did drink 2 beers, but none today, patient had ED visit x2 in June and July for syncope, but no history of seizures in the past,. -In ED patient with tongue laceration required suture x4, and labs abnormalities, CT head CSF attenuation structure within the suprasellar region measuring approximately 3.4 x 2.2 x 2.2 cm, which is unchanged in size and appearance compared to the prior CT 07/26/2019.  Otherwise no acute  findings,  hospitalist consulted to admit.   Review of systems:    In addition to the HPI above,  No Fever-chills,she reports seizures She reports headache no changes with Vision or hearing, No problems swallowing food or Liquids, No Chest pain, Cough or Shortness of Breath, No Abdominal pain, No Nausea or Vommitting, Bowel movements are regular, No Blood in stool or Urine, No dysuria, No new skin rashes or  bruises, No new joints pains-aches,  No new weakness, tingling, numbness in any extremity, No recent weight gain or loss, No polyuria, polydypsia or polyphagia, No significant Mental Stressors.  A full 10 point Review of Systems was done, except as stated above, all other Review of Systems were negative.   With Past History of the following :    Past Medical History:  Diagnosis Date  . Narcotic overdose (HCC)   . Syncope       Past Surgical History:  Procedure Laterality Date  . LEEP        Social History:     Social History   Tobacco Use  . Smoking status: Current Every Day Smoker    Packs/day: 0.50    Types: Cigarettes  . Smokeless tobacco: Never Used  Substance Use Topics  . Alcohol use: Yes    Comment: 1-2 beers per day       Family History :     Family History  Problem Relation Age of Onset  .  Diabetes Mother       Home Medications:   Prior to Admission medications   Medication Sig Start Date End Date Taking? Authorizing Provider  Aspirin-Acetaminophen-Caffeine (GOODY HEADACHE PO) Take 1-2 packets by mouth daily as needed (for pain).    [provider]  omeprazole (PRILOSEC) 40 MG capsule Take 40 mg by mouth daily. 11/13/19   [provider]     Allergies:    No Known Allergies   Physical Exam:   Vitals  Blood pressure (!) 142/96, pulse 93, temperature 98.8 F (37.1 C), resp. rate (!) 24, SpO2 99 %.   1. General developed female, laying in bed, no apparent distress  2. Normal affect and insight, Not Suicidal or Homicidal, Awake Alert, Oriented X 3.  3. No F.N deficits, ALL C.Nerves Intact, Strength 5/5 all 4 extremities, Sensation intact all 4 extremities, Plantars down going.  4. Ears and Eyes appear Normal, Conjunctivae clear, PERRLA. Moist Oral Mucosa.  Tongue laceration, please see picture below.  5. Supple Neck, No JVD, No cervical lymphadenopathy appriciated, No Carotid Bruits.  6. Symmetrical Chest wall  movement, Good air movement bilaterally, CTAB.  7. RRR, No Gallops, Rubs or Murmurs, No Parasternal Heave.  8. Positive Bowel Sounds, Abdomen Soft, No tenderness, No organomegaly appriciated,No rebound -guarding or rigidity.  9.  No Cyanosis, Normal Skin Turgor, No Skin Rash or Bruise.  10. Good muscle tone,  joints appear normal , no effusions, Normal ROM.  11. No Palpable Lymph Nodes in Neck or Axillae       Data Review:    CBC Recent Labs  Lab 12/02/19 1053  WBC 10.8*  HGB 11.6*  HCT 34.7*  PLT 185  MCV 100.9*  MCH 33.7  MCHC 33.4  RDW 16.2*  LYMPHSABS 0.6*  MONOABS 0.9  EOSABS 0.0  BASOSABS 0.1   ------------------------------------------------------------------------------------------------------------------  Chemistries  Recent Labs  Lab 12/02/19 1053  NA 142  K 4.0  CL 109  CO2 24  GLUCOSE 103*  BUN 8  CREATININE 0.83  CALCIUM 8.8*   ------------------------------------------------------------------------------------------------------------------ CrCl cannot be calculated (Unknown ideal weight.). ------------------------------------------------------------------------------------------------------------------ No results for input(s): TSH, T4TOTAL, T3FREE, THYROIDAB in the last 72 hours.  Invalid input(s): FREET3  Coagulation profile No results for input(s): INR, PROTIME in the last 168 hours. ------------------------------------------------------------------------------------------------------------------- No results for input(s): DDIMER in the last 72 hours. -------------------------------------------------------------------------------------------------------------------  Cardiac Enzymes No results for input(s): CKMB, TROPONINI, MYOGLOBIN in the last 168 hours.  Invalid input(s): CK ------------------------------------------------------------------------------------------------------------------ No results found for:  BNP   ---------------------------------------------------------------------------------------------------------------  Urinalysis    Component Value Date/Time   COLORURINE YELLOW 09/06/2019 1947   APPEARANCEUR HAZY (A) 09/06/2019 1947   LABSPEC 1.017 09/06/2019 1947   PHURINE 5.0 09/06/2019 1947   GLUCOSEU NEGATIVE 09/06/2019 1947   HGBUR NEGATIVE 09/06/2019 1947   BILIRUBINUR NEGATIVE 09/06/2019 1947   KETONESUR NEGATIVE 09/06/2019 1947   PROTEINUR NEGATIVE 09/06/2019 1947   UROBILINOGEN 0.2 10/01/2014 2113   NITRITE NEGATIVE 09/06/2019 1947   LEUKOCYTESUR TRACE (A) 09/06/2019 1947    ----------------------------------------------------------------------------------------------------------------   Imaging Results:    CT HEAD WO CONTRAST  Result Date: 12/02/2019 CLINICAL DATA:  Seizure EXAM: CT HEAD WITHOUT CONTRAST TECHNIQUE: Contiguous axial images were obtained from the base of the skull through the vertex without intravenous contrast. COMPARISON:  07/26/2019 FINDINGS: Brain: Redemonstration of a CSF attenuation structure within the suprasellar region measuring approximately 3.4 x 2.2 x 2.2 cm, which is unchanged in size and appearance compared to the prior CT 07/26/2019. No evidence of  acute infarction. No intracranial hemorrhage. No extra-axial collection. No hydrocephalus. Vascular: No hyperdense vessel or unexpected calcification. Skull: Normal. Negative for fracture or focal lesion. Sinuses/Orbits: No acute finding. Other: None. IMPRESSION: 1. No acute intracranial findings. 2. Stable CSF attenuation structure within the suprasellar region, which may represent an arachnoid cyst or other cystic mass. Nonemergent contrast enhanced MRI of the brain is recommended for further characterization, if not recently performed elsewhere. Electronically Signed   By: Duanne Guess D.O.   On: 12/02/2019 13:03    My personal review of EKG: Rhythm sinus tachycardia rate  112 /min, QTc  456   Assessment & Plan:    Active Problems:   Seizure (HCC)   Polysubstance abuse (HCC)  Seizures -New onset, no clear etiology, but she has history of alcohol abuse, and  polysubstance abuse, can be related to that. -Keep her on seizure precautioNS. -Neurology consulted by ED . - Will obtain EEG -CT head with no acute intracranial findings, but stable CSF attenuation structure within the suprasellar region, MRI brain is pending. -We will start on Keppra, will load with 1 g, continue with the 500 twice daily until final neurology recommendations  Polysubstance abuse -She was counseled.  Alcohol abuse -We will keep on CIWA protocol  Tobacco abuse -Plan nicotine patch  Tongue laceration -Due to seizures, she required 4 sutures in ED, will keep on clear liquid diet   DVT Prophylaxis SCDs  AM Labs Ordered, also please review Full Orders  Family Communication: Admission, patients condition and plan of care including tests being ordered have been discussed with the patient  who indicate understanding and agree with the plan and Code Status.  Code Status fULL  Likely DC to  Home  Condition GUARDED   Consults called: NEUROLOGY    Admission status: Observation   Time spent in minutes : 55 minutes   Huey Bienenstock M.D on 12/02/2019 at 4:45 PM   [facility] - Office  [phone]

## 2019-12-03 ENCOUNTER — Observation Stay (HOSPITAL_COMMUNITY)
Admit: 2019-12-03 | Discharge: 2019-12-03 | Disposition: A | Discharge status: Home / Self Care | Payer: 59 | Attending: Internal Medicine | Admitting: Internal Medicine

## 2019-12-03 ENCOUNTER — Encounter (HOSPITAL_COMMUNITY): Payer: Self-pay | Admitting: Internal Medicine

## 2019-12-03 DIAGNOSIS — E876 Hypokalemia: Secondary | ICD-10-CM | POA: Diagnosis not present

## 2019-12-03 DIAGNOSIS — G930 Cerebral cysts: Secondary | ICD-10-CM | POA: Diagnosis present

## 2019-12-03 DIAGNOSIS — R569 Unspecified convulsions: Secondary | ICD-10-CM | POA: Diagnosis not present

## 2019-12-03 DIAGNOSIS — Z720 Tobacco use: Secondary | ICD-10-CM | POA: Diagnosis present

## 2019-12-03 DIAGNOSIS — R509 Fever, unspecified: Secondary | ICD-10-CM

## 2019-12-03 DIAGNOSIS — F1910 Other psychoactive substance abuse, uncomplicated: Secondary | ICD-10-CM | POA: Diagnosis not present

## 2019-12-03 LAB — COMPREHENSIVE METABOLIC PANEL
ALT: 26 U/L (ref 0–44)
AST: 89 U/L — ABNORMAL HIGH (ref 15–41)
Albumin: 3.4 g/dL — ABNORMAL LOW (ref 3.5–5.0)
Alkaline Phosphatase: 60 U/L (ref 38–126)
Anion gap: 9 (ref 5–15)
BUN: 6 mg/dL (ref 6–20)
CO2: 24 mmol/L (ref 22–32)
Calcium: 8.6 mg/dL — ABNORMAL LOW (ref 8.9–10.3)
Chloride: 104 mmol/L (ref 98–111)
Creatinine, Ser: 0.72 mg/dL (ref 0.44–1.00)
GFR, Estimated: >60 mL/min (ref >60)
Glucose, Bld: 82 mg/dL (ref 70–99)
Potassium: 3.4 mmol/L — ABNORMAL LOW (ref 3.5–5.1)
Sodium: 137 mmol/L (ref 135–145)
Total Bilirubin: 0.7 mg/dL (ref 0.3–1.2)
Total Protein: 6.3 g/dL — ABNORMAL LOW (ref 6.5–8.1)

## 2019-12-03 LAB — URINALYSIS, ROUTINE W REFLEX MICROSCOPIC
Bacteria, UA: NONE SEEN
Bilirubin Urine: NEGATIVE
Glucose, UA: NEGATIVE mg/dL
Ketones, ur: 5 mg/dL — AB
Nitrite: NEGATIVE
Protein, ur: NEGATIVE mg/dL
Specific Gravity, Urine: 1.003 — ABNORMAL LOW (ref 1.005–1.030)
pH: 5 (ref 5.0–8.0)

## 2019-12-03 LAB — CBC
HCT: 31.2 % — ABNORMAL LOW (ref 36.0–46.0)
Hemoglobin: 10.7 g/dL — ABNORMAL LOW (ref 12.0–15.0)
MCH: 34 pg (ref 26.0–34.0)
MCHC: 34.3 g/dL (ref 30.0–36.0)
MCV: 99 fL (ref 80.0–100.0)
Platelets: 167 10*3/uL (ref 150–400)
RBC: 3.15 MIL/uL — ABNORMAL LOW (ref 3.87–5.11)
RDW: 15.9 % — ABNORMAL HIGH (ref 11.5–15.5)
WBC: 7.2 10*3/uL (ref 4.0–10.5)
nRBC: 0 % (ref 0.0–0.2)

## 2019-12-03 LAB — HIV ANTIBODY (ROUTINE TESTING W REFLEX): HIV Screen 4th Generation wRfx: NONREACTIVE

## 2019-12-03 LAB — MAGNESIUM: Magnesium: 2.2 mg/dL (ref 1.7–2.4)

## 2019-12-03 LAB — SEDIMENTATION RATE: Sed Rate: 25 mm/hr — ABNORMAL HIGH (ref 0–22)

## 2019-12-03 LAB — VITAMIN B12: Vitamin B-12: 306 pg/mL (ref 180–914)

## 2019-12-03 LAB — C-REACTIVE PROTEIN: CRP: 5.5 mg/dL — ABNORMAL HIGH (ref <1.0)

## 2019-12-03 MED ORDER — POTASSIUM CHLORIDE 20 MEQ PO PACK
40.0000 meq | PACK | Freq: Once | ORAL | Status: AC
  Administered 2019-12-03: 40 meq via ORAL
  Filled 2019-12-03: qty 2

## 2019-12-03 MED ORDER — NICOTINE 14 MG/24HR TD PT24
14.0000 mg | MEDICATED_PATCH | Freq: Every day | TRANSDERMAL | Status: DC
  Administered 2019-12-03 – 2019-12-04 (×2): 14 mg via TRANSDERMAL
  Filled 2019-12-03 (×2): qty 1

## 2019-12-03 MED ORDER — CHLORHEXIDINE GLUCONATE 0.12 % MT SOLN
15.0000 mL | Freq: Two times a day (BID) | OROMUCOSAL | Status: DC
  Administered 2019-12-03 – 2019-12-04 (×2): 15 mL via OROMUCOSAL
  Filled 2019-12-03 (×2): qty 15

## 2019-12-03 MED ORDER — ORAL CARE MOUTH RINSE
15.0000 mL | Freq: Two times a day (BID) | OROMUCOSAL | Status: DC
  Administered 2019-12-03 – 2019-12-04 (×2): 15 mL via OROMUCOSAL

## 2019-12-03 NOTE — Plan of Care (Signed)
Admitted, oriented to room & safety of using call light. No questions at this time.
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
## 2019-12-03 NOTE — Procedures (Signed)
Patient Name: Molly Murillo  MRN: [id number]  Epilepsy Attending: Charlsie Quest  Referring Physician/Provider: Dr Huey Bienenstock  Date: 12/03/2019 Duration: 29.45 minutes  Patient history: 49 year old female with history of alcohol abuse and polysubstance abuse who presented with new onset seizures.  EEG to evaluate for seizures.  Level of alertness: Awake, asleep  AEDs during EEG study: Keppra  Technical aspects: This EEG study was done with scalp electrodes positioned according to the 10-20 International system of electrode placement. Electrical activity was acquired at a sampling rate of 500Hz  and reviewed with a high frequency filter of 70Hz  and a low frequency filter of 1Hz . EEG data were recorded continuously and digitally stored.   Description: The posterior dominant rhythm consists of 9 Hz activity of moderate voltage (25-35 uV) seen predominantly in posterior head regions, symmetric and reactive to eye opening and eye closing.  Sleep was characterized by vertex waves, sleep spindles (12 to 14 Hz), maximal frontocentral region. Hyperventilation did not show any EEG change.  Physiologic photic driving was not seen during photic stimulation.   IMPRESSION: This study is within normal limits. No seizures or epileptiform discharges were seen throughout the recording.  [name] 

## 2019-12-03 NOTE — Progress Notes (Signed)
EEG complete - results pending 

## 2019-12-03 NOTE — TOC Progression Note (Signed)
Transition of Care [facility]) - Progression Note    Patient Details  Name: Molly Murillo MRN: [id number] Date of Birth: 1971-02-15  [facility] CM/SW Contact  Barry Brunner, LCSW Phone Number: 12/03/2019, 4:31 PM  Clinical Narrative:    CSW received SA consult for patient. CSW provided SA consult and resources. Patient agreeable to receive resources. TOC to follow.        Expected Discharge Plan and Services                                                 Social Determinants of Health (SDOH) Interventions    Readmission Risk Interventions No flowsheet data found.

## 2019-12-03 NOTE — Consult Note (Signed)
[facility] Molly Cromie A. Gerilyn Pilgrimoonquah, MD     [email or web address]          Molly Murillo is an 49 y.o. female.   ASSESSMENT/PLAN: 1.  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  This can be associated with seizures and therefore she likely is at increased risk of epilepsy.  Long-term Keppra or other antiepileptic medications are recommended.  Seizure precaution including avoiding operating machinery or driving is recommended.  Other precaution includes avoiding swimming, taking baths and heights. 2.  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  Does not seem to be requiring surgery at this time but will need formal neurosurgical consultation and follow-up.  Also recommend formal visual field testing to evaluate for any deficits.  Additional labs also obtained. 3.  Extensive trauma to the tongue: Ibuprofen 800 mg and mouthwash is recommended.   This is a 49 year old female who presents with new onset seizures.  There is no previous history of seizures.  She does report having a son and grandchild with seizures however.  There are no reports of head injuries, central nervous system infections or stroke.  The patient is amnestic to the event.  The patient apparently had a generalized grand mal seizures with oral trauma.  She complains of having significant pain involving the lower extremities.  She also complains of severe pain to the tongue which is expected.  No headaches are reported although she did report having a headache to the hospitalist.  No clear visual symptoms at this time but there may have been some visual symptoms previously.  No palpitation, chest pain or shortness of breath.  Review of systems otherwise negative.    GENERAL: Pleasant female who is doing well at this time.  HEENT: There is severe tongue biting injuries with hemorrhage noted.  ABDOMEN: soft  EXTREMITIES: No edema   BACK: Normal  SKIN: Normal by  inspection.    MENTAL STATUS: Alert and oriented. Speech, language and cognition are generally intact. Judgment and insight normal.   CRANIAL NERVES: Pupils are equal, round and reactive to light and accomodation; extra ocular movements are full, there is no significant nystagmus; visual fields are full; upper and lower facial muscles are normal in strength and symmetric, there is no flattening of the nasolabial folds; tongue is midline; uvula is midline; shoulder elevation is normal.  MOTOR: Mild pronator drift right upper extremity.  Mild leg weakness 4+/5 both hip flexion and dorsiflexion.  Right upper extremity normal strength and bulk and testing.  The left side shows normal tone, bulk and strength.  COORDINATION: Left finger to nose is normal, right finger to nose is normal, No rest tremor; no intention tremor; no postural tremor; no bradykinesia.  REFLEXES: Deep tendon reflexes are symmetrical and normal. Plantar reflexes are flexor bilaterally.   SENSATION: Normal to light touch, temperature, and pain.       Blood pressure 134/90, pulse 97, temperature (!) 101.4 F (38.6 C), temperature source Oral, resp. rate 20, height 5\' 6"  (1.676 m), weight 67.6 kg, SpO2 100 %.  Past Medical History:  Diagnosis Date  . Narcotic overdose (HCC)   . Syncope     Past Surgical History:  Procedure Laterality Date  . LEEP      Family History  Problem Relation Age of Onset  . Diabetes Mother     Social History:  reports that she has been smoking cigarettes. She has been smoking about 0.50 packs per day. She has  never used smokeless tobacco. She reports current alcohol use. She reports that she does not use drugs.  Allergies: No Known Allergies  Medications: Prior to Admission medications   Medication Sig Start Date End Date Taking? Authorizing Provider  Aspirin-Acetaminophen-Caffeine (GOODY HEADACHE PO) Take 1-2 packets by mouth daily as needed (for pain).   Yes [provider]  omeprazole (PRILOSEC) 40 MG capsule Take 40 mg by mouth daily. 11/13/19  Yes [provider]    Scheduled Meds: . chlorhexidine  15 mL Mouth Rinse BID  . folic acid  1 mg Oral Daily  . levETIRAcetam  500 mg Oral BID  . mouth rinse  15 mL Mouth Rinse q12n4p  . multivitamin with minerals  1 tablet Oral Daily  . pantoprazole  40 mg Oral Daily  . thiamine  100 mg Oral Daily   Or  . thiamine  100 mg Intravenous Daily   Continuous Infusions: PRN Meds:.acetaminophen **OR** acetaminophen, butalbital-acetaminophen-caffeine, HYDROcodone-acetaminophen, LORazepam **OR** LORazepam, magic mouthwash w/lidocaine     Results for orders placed or performed during the hospital encounter of 12/02/19 (from the past 48 hour(s))  Basic metabolic panel     Status: Abnormal   Collection Time: 12/02/19 10:53 AM  Result Value Ref Range   Sodium 142 135 - 145 mmol/L   Potassium 4.0 3.5 - 5.1 mmol/L   Chloride 109 98 - 111 mmol/L   CO2 24 22 - 32 mmol/L   Glucose, Bld 103 (H) 70 - 99 mg/dL    Comment: Glucose reference range applies only to samples taken after fasting for at least 8 hours.   BUN 8 6 - 20 mg/dL   Creatinine, Ser 8.63 0.44 - 1.00 mg/dL   Calcium 8.8 (L) 8.9 - 10.3 mg/dL   GFR, Estimated >81 >77 mL/min   Anion gap 9 5 - 15    Comment: Performed at [facility], [address], [place], [place] [address]  CBC WITH DIFFERENTIAL     Status: Abnormal   Collection Time: 12/02/19 10:53 AM  Result Value Ref Range   WBC 10.8 (H) 4.0 - 10.5 K/uL   RBC 3.44 (L) 3.87 - 5.11 MIL/uL   Hemoglobin 11.6 (L) 12.0 - 15.0 g/dL   HCT 90.3 (L) 36 - 46 %   MCV 100.9 (H) 80.0 - 100.0 fL   MCH 33.7 26.0 - 34.0 pg   MCHC 33.4 30.0 - 36.0 g/dL   RDW 83.3 (H) 38.3 - 29.1 %   Platelets 185 150 - 400 K/uL   nRBC 0.0 0.0 - 0.2 %   Neutrophils Relative % 85 %   Neutro Abs 9.2 (H) 1.7 - 7.7 K/uL   Lymphocytes Relative 5 %   Lymphs Abs 0.6 (L) 0.7 - 4.0 K/uL   Monocytes Relative 8 %   Monocytes  Absolute 0.9 0.1 - 1.0 K/uL   Eosinophils Relative 0 %   Eosinophils Absolute 0.0 0.0 - 0.5 K/uL   Basophils Relative 1 %   Basophils Absolute 0.1 0.0 - 0.1 K/uL   Immature Granulocytes 1 %   Abs Immature Granulocytes 0.07 0.00 - 0.07 K/uL    Comment: Performed at [facility], [address] [place], [place] [address]  Salicylate level     Status: Abnormal   Collection Time: 12/02/19 10:53 AM  Result Value Ref Range   Salicylate Lvl <7.0 (L) 7.0 - 30.0 mg/dL    Comment: Performed at [facility], [address] [place], [place] [address]  Acetaminophen level  Status: Abnormal   Collection Time: 12/02/19 10:53 AM  Result Value Ref Range   Acetaminophen (Tylenol), Serum <10 (L) 10 - 30 ug/mL    Comment: (NOTE) Therapeutic concentrations vary significantly. A range of 10-30 ug/mL  may be an effective concentration for many patients. However, some  are best treated at concentrations outside of this range. Acetaminophen concentrations >150 ug/mL at 4 hours after ingestion  and >50 ug/mL at 12 hours after ingestion are often associated with  toxic reactions.  Performed at [facility], [address] [place], [place] [address]   Ethanol/ETOH     Status: None   Collection Time: 12/02/19 10:53 AM  Result Value Ref Range   Alcohol, Ethyl (B) <10 <10 mg/dL    Comment: (NOTE) Lowest detectable limit for serum alcohol is 10 mg/dL.  For medical purposes only. Performed at [facility], [address], [place], [place] [address]   HIV Antibody (routine testing w rflx)     Status: None   Collection Time: 12/02/19 10:53 AM  Result Value Ref Range   HIV Screen 4th Generation wRfx Non Reactive Non Reactive    Comment: Performed at [facility], [address], [place], [place] [address]  CBG monitoring, ED     Status: Abnormal   Collection Time: 12/02/19 11:49 AM  Result Value Ref Range   Glucose-Capillary 116 (H) 70 - 99 mg/dL    Comment: Glucose reference range applies only to  samples taken after fasting for at least 8 hours.  POC urine preg, ED     Status: None   Collection Time: 12/02/19  1:49 PM  Result Value Ref Range   Preg Test, Ur NEGATIVE NEGATIVE    Comment:        THE SENSITIVITY OF THIS METHODOLOGY IS >24 mIU/mL   Urine rapid drug screen (hosp performed)     Status: Abnormal   Collection Time: 12/02/19  2:11 PM  Result Value Ref Range   Opiates NONE DETECTED NONE DETECTED   Cocaine POSITIVE (A) NONE DETECTED   Benzodiazepines POSITIVE (A) NONE DETECTED   Amphetamines NONE DETECTED NONE DETECTED   Tetrahydrocannabinol POSITIVE (A) NONE DETECTED   Barbiturates NONE DETECTED NONE DETECTED    Comment: (NOTE) DRUG SCREEN FOR MEDICAL PURPOSES ONLY.  IF CONFIRMATION IS NEEDED FOR ANY PURPOSE, NOTIFY LAB WITHIN 5 DAYS.  LOWEST DETECTABLE LIMITS FOR URINE DRUG SCREEN Drug Class                     Cutoff (ng/mL) Amphetamine and metabolites    1000 Barbiturate and metabolites    200 Benzodiazepine                 200 Tricyclics and metabolites     300 Opiates and metabolites        300 Cocaine and metabolites        300 THC                            50 Performed at [facility], [address] [place], [place] [address]   Respiratory Panel by RT PCR (Flu A&B, Covid) - Nasopharyngeal Swab     Status: None   Collection Time: 12/02/19  9:40 PM   Specimen: Nasopharyngeal Swab  Result Value Ref Range   SARS Coronavirus 2 by RT PCR NEGATIVE NEGATIVE    Comment: (NOTE) SARS-CoV-2 target nucleic acids are NOT DETECTED.  The SARS-CoV-2  RNA is generally detectable in upper respiratoy specimens during the acute phase of infection. The lowest concentration of SARS-CoV-2 viral copies this assay can detect is 131 copies/mL. A negative result does not preclude SARS-Cov-2 infection and should not be used as the sole basis for treatment or other patient management decisions. A negative result may occur with  improper specimen collection/handling,  submission of specimen other than nasopharyngeal swab, presence of viral mutation(s) within the areas targeted by this assay, and inadequate number of viral copies (<131 copies/mL). A negative result must be combined with clinical observations, patient history, and epidemiological information. The expected result is Negative.  Fact Sheet for Patients:  https://www.moore.com/  Fact Sheet for Healthcare Providers:  https://www.young.biz/  This test is no t yet approved or cleared by the [place] FDA and  has been authorized for detection and/or diagnosis of SARS-CoV-2 by FDA under an Emergency Use Authorization (EUA). This EUA will remain  in effect (meaning this test can be used) for the duration of the COVID-19 declaration under Section 564(b)(1) of the Act, 21 U.S.C. section 360bbb-3(b)(1), unless the authorization is terminated or revoked sooner.     Influenza A by PCR NEGATIVE NEGATIVE   Influenza B by PCR NEGATIVE NEGATIVE    Comment: (NOTE) The Xpert Xpress SARS-CoV-2/FLU/RSV assay is intended as an aid in  the diagnosis of influenza from Nasopharyngeal swab specimens and  should not be used as a sole basis for treatment. Nasal washings and  aspirates are unacceptable for Xpert Xpress SARS-CoV-2/FLU/RSV  testing.  Fact Sheet for Patients: https://www.moore.com/  Fact Sheet for Healthcare Providers: https://www.young.biz/  This test is not yet approved or cleared by the [place] FDA and  has been authorized for detection and/or diagnosis of SARS-CoV-2 by  FDA under an Emergency Use Authorization (EUA). This EUA will remain  in effect (meaning this test can be used) for the duration of the  Covid-19 declaration under Section 564(b)(1) of the Act, 21  U.S.C. section 360bbb-3(b)(1), unless the authorization is  terminated or revoked. Performed at [facility], [address],  [place], [place] [address]   Comprehensive metabolic panel     Status: Abnormal   Collection Time: 12/03/19  6:28 AM  Result Value Ref Range   Sodium 137 135 - 145 mmol/L   Potassium 3.4 (L) 3.5 - 5.1 mmol/L   Chloride 104 98 - 111 mmol/L   CO2 24 22 - 32 mmol/L   Glucose, Bld 82 70 - 99 mg/dL    Comment: Glucose reference range applies only to samples taken after fasting for at least 8 hours.   BUN 6 6 - 20 mg/dL   Creatinine, Ser 7.16 0.44 - 1.00 mg/dL   Calcium 8.6 (L) 8.9 - 10.3 mg/dL   Total Protein 6.3 (L) 6.5 - 8.1 g/dL   Albumin 3.4 (L) 3.5 - 5.0 g/dL   AST 89 (H) 15 - 41 U/L   ALT 26 0 - 44 U/L   Alkaline Phosphatase 60 38 - 126 U/L   Total Bilirubin 0.7 0.3 - 1.2 mg/dL   GFR, Estimated >96 >78 mL/min   Anion gap 9 5 - 15    Comment: Performed at [facility], [address] [place], [place] [address]  CBC     Status: Abnormal   Collection Time: 12/03/19  6:28 AM  Result Value Ref Range   WBC 7.2 4.0 - 10.5 K/uL   RBC 3.15 (L) 3.87 - 5.11 MIL/uL   Hemoglobin 10.7 (L)  12.0 - 15.0 g/dL   HCT 94.1 (L) 36 - 46 %   MCV 99.0 80.0 - 100.0 fL   MCH 34.0 26.0 - 34.0 pg   MCHC 34.3 30.0 - 36.0 g/dL   RDW 74.0 (H) 81.4 - 48.1 %   Platelets 167 150 - 400 K/uL   nRBC 0.0 0.0 - 0.2 %    Comment: Performed at [facility], [address], [place], [place] [address]  Magnesium     Status: None   Collection Time: 12/03/19  6:28 AM  Result Value Ref Range   Magnesium 2.2 1.7 - 2.4 mg/dL    Comment: Performed at [facility], [address] [place], [place] [address]    Studies/Results:  BRAIN MRI W/WO FINDINGS: Brain: Mildly lobulated and irregular cystic and nonenhancing mass of the suprasellar cistern measures 2-3 cm diameter (series 16, image 24 and series 17, image 63) as on the June CT. The lesion is isointense to CSF on T1 and T2, but does not suppress on FLAIR and demonstrates intense increased signal on DWI (series 5, image 13) consistent with an intracranial epidermoid  cyst.  Stable regional mass effect.  No associated cerebral edema.  No other extra-axial collection or intracranial mass lesion identified. No superimposed restricted diffusion suggestive of acute infarction. No midline shift, ventriculomegaly, or acute intracranial hemorrhage. Cervicomedullary junction within normal limits. Attenuated appearance of the pituitary and infundibulum.  Coronal thin slice temporal lobe imaging is motion degraded despite repeated imaging attempts but demonstrates a relatively symmetric, normal appearance of the hippocampal formations.  No abnormal enhancement identified. No dural thickening. Background gray and white matter signal is normal for age. No encephalomalacia or chronic cerebral blood products identified.  Vascular: Major intracranial vascular flow voids are preserved. The major dural venous sinuses are enhancing and appear to be patent.  Skull and upper cervical spine: Visualized bone marrow signal is within normal limits. Negative visible cervical spine.  Sinuses/Orbits: Mass effect on the optic chiasm from the suprasellar epidermoid. Intraorbital soft tissues appear negative.  Other: Paranasal sinuses and mastoids are stable and well pneumatized. Scalp and face soft tissues appear negative.  IMPRESSION: 1. Positive for a 3 cm suprasellar intracranial Epidermoid Cyst. These are congenital lesions which typically present in middle age due to mass effect on adjacent structures, and they can be associated with seizures.  2. No other acute intracranial abnormality, otherwise negative MRI appearance of the brain.     The brain MRI scan is reviewed in person. There is suprasellar arm lesion seen moderate size that is dark on T1 but isodense on FLAIR imaging. There is mild mass effect on the midbrain and the medial temporal structures. There is no significant enhancement. No hemorrhage noted. No encephalomalacia or white matter  lesions noted.   Josealberto Montalto A. Gerilyn Pilgrim, M.D.  Diplomate, Biomedical engineer of Psychiatry and Neurology ( Neurology). 12/03/2019, 2:35 PM

## 2019-12-04 ENCOUNTER — Observation Stay (HOSPITAL_COMMUNITY): Payer: 59

## 2019-12-04 DIAGNOSIS — M542 Cervicalgia: Secondary | ICD-10-CM

## 2019-12-04 DIAGNOSIS — G930 Cerebral cysts: Secondary | ICD-10-CM | POA: Diagnosis not present

## 2019-12-04 DIAGNOSIS — Z720 Tobacco use: Secondary | ICD-10-CM

## 2019-12-04 DIAGNOSIS — E876 Hypokalemia: Secondary | ICD-10-CM

## 2019-12-04 DIAGNOSIS — F1910 Other psychoactive substance abuse, uncomplicated: Secondary | ICD-10-CM | POA: Diagnosis not present

## 2019-12-04 LAB — HOMOCYSTEINE: Homocysteine: <3 umol/L (ref 0.0–14.5)

## 2019-12-04 LAB — RPR: RPR Ser Ql: NONREACTIVE

## 2019-12-04 MED ORDER — MAGIC MOUTHWASH W/LIDOCAINE: 5.0000 mL | Freq: Four times a day (QID) | ORAL | 0 refills | Status: AC | PRN

## 2019-12-04 MED ORDER — FOLIC ACID 1 MG PO TABS
1.0000 mg | ORAL_TABLET | Freq: Every day | ORAL | 0 refills | Status: DC
Start: 2019-12-04 — End: 2021-06-23

## 2019-12-04 MED ORDER — LEVETIRACETAM 500 MG PO TABS
500.0000 mg | ORAL_TABLET | Freq: Two times a day (BID) | ORAL | 1 refills | Status: DC
Start: 2019-12-04 — End: 2021-05-03

## 2019-12-04 MED ORDER — THIAMINE HCL 100 MG PO TABS
100.0000 mg | ORAL_TABLET | Freq: Every day | ORAL | 0 refills | Status: DC
Start: 2019-12-04 — End: 2021-06-23

## 2019-12-04 NOTE — Discharge Summary (Signed)
Molly Murillo DQQ:[id number] DOB: 07-10-1970 DOA: 12/02/2019  PCP: Patient, No Pcp Per  Admit date: 12/02/2019 Discharge date: 12/04/2019  Admitted From: home Disposition:  home  Recommendations for Outpatient Follow-up:  1. Follow up with PCP.  She will make an appointment to set up with 2. Neurosurgery follow up will be needed, appt information provided 3. She will follow up with Neurology ( her son has one) 4. Keppra 500 mg TID--new medication,  Oral mouth wash with lidocaine for pain control for repaired tongue laceration as well as OTC ibuprofen  Home Health:none needed  Equipment/Devices:none  Discharge Condition:heart healthy CODE STATUS: FULL     Brief/Interim Summary: History of present illness:  Ms. Wolman is a 49 year old female with no significant medical history who presented to the ED on 10/13 after witnessed seizure by her boyfriend with resultant tongue biting and found to have a tongue laceration.  Patient had additional 2 episodes witnessed by EMS for which she received 2.5 mg of Versed.  In the ED patient underwent tongue laceration repair.  In the ED she was afebrile with respiratory rate of 26, heart rate 102, blood pressure 154/102, with lab work significant for potassium 3.4, hemoglobin 11.6, Covid test negative, UDS positive for cocaine, benzodiazepines, THC.  Patient's salicylate and Tylenol levels were unremarkable.  Ethanol level was less than 10.  CT head showed no acute intracranial findings did show possible cystic mass in the suprasellar region.  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    Remaining hospital course addressed in problem based format below:   Hospital Course:   New onset seizures, secondary to suprasellar epidermoid cyst and likely further complicated by substance abuse.  Patient admits to some substance use and UDS confirms cocaine/THC/benzodiazepines which could  contribute; however, patient also found to have suprasellar epidermoid cyst on MRI brain which could also be potential cause of seizure though there is stable mass-effect with no cerebral edema on MRI brain patient does report symptoms that suggest possible mass-effect given headache and changes in vision for the past month likely worse in the setting of substance abuse. Neurology agrees and recommends continued anti seizure medication and close outpatient follow up with neurosurgery. EEG shows no seizure activity.   RPR/homocysteine/B12 levels unremarkable -Strict seizure precautions provided on discharge, avoiding driving, swimming, taking a bath, heights -Patient was loaded with Keppra during hospital stay and will continue Keppra at 500 mg twice daily  --outpatient follow up with neurosurgery to be placed on discharge -Patient will arrange PCP follow-up  Fever x1 episode, resolved unclear etiology.  Occurred on 10/14 with a T-max of 101 has not localizing symptoms. CXR and UA unremarkable.  No leukocytosis obtain neck x-ray due to reported neck pain and appreciable tenderness which was unremarkable, seems more consistent with reactive lymphadenopathy.  Fever did not precede prior seizure -ESR (25), CRP (5) slightly elevated  Polysubstance abuse.  Patient counseled on admission regarding cessation of cocaine/THC and how this is likely exacerbating risk factors to her known suprasellar disease in terms of her increased risk for seizures  Reported alcohol abuse.  No signs of withdrawal during hospitalization -Continue Multivitamin, folic acid, thiamine  Tobacco abuse -Nicotine patch -Counseling provided on cessation  Tongue laceration, status post suturing in the ED -Tolerated diet -On discharge continue lidocaine mouthwash as well as ibuprofen for pain control  Hypokalemia.  No hypomagnesemia -Repleted orally     Consultations:  Neurology  Procedures/Studies: EEG,  10/14  This study is within normal limits. No seizures or epileptiform discharges were seen throughout the   Subjective: No new headache, no new seizures overnight.  No new fevers.  Swallowing well. Discharge Exam: Vitals:   12/03/19 2116 12/04/19 0142  BP: 137/86 140/88  Pulse: 89 79  Resp: 18 16  Temp: 99.5 F (37.5 C) 98.7 F (37.1 C)  SpO2: 100% 100%   Vitals:   12/03/19 1600 12/03/19 2116 12/03/19 2116 12/04/19 0142  BP:   137/86 140/88  Pulse:   89 79  Resp:   18 16  Temp: 99.9 F (37.7 C)  99.5 F (37.5 C) 98.7 F (37.1 C)  TempSrc: Oral  Oral Oral  SpO2:  100% 100% 100%  Weight:      Height:        General: Lying in bed, no apparent distress Eyes: EOMI, anicteric ENT: Oral Mucosa clear and moist.  Tongue laceration repaired.  Palpable lymphadenopathy Cardiovascular: regular rate and rhythm, no murmurs, rubs or gallops, no edema, Respiratory: Normal respiratory effort on room air, lungs clear to auscultation bilaterally Abdomen: soft, non-distended, non-tender, normal bowel sounds Skin: No Rash Neurologic: Grossly no focal neuro deficit.Mental status AAOx3, speech normal, Psychiatric:Appropriate affect, and mood  Discharge Diagnoses:  Active Problems:   Seizure ([place])   Polysubstance abuse ([place])   Hypokalemia   Suprasellar cyst   Tobacco abuse    Discharge Instructions  Discharge Instructions    Diet - low sodium heart healthy   Complete by: As directed    Increase activity slowly   Complete by: As directed      Allergies as of 12/04/2019   No Known Allergies     Medication List    STOP taking these medications   GOODY HEADACHE PO     TAKE these medications   folic acid 1 MG tablet Commonly known as: FOLVITE Take 1 tablet (1 mg total) by mouth daily.   levETIRAcetam 500 MG tablet Commonly known as: KEPPRA Take 1 tablet (500 mg total) by mouth 2 (two) times daily.   magic mouthwash w/lidocaine Soln Take 5 mLs by mouth 4 (four)  times daily as needed for up to 5 days for mouth pain.   omeprazole 40 MG capsule Commonly known as: PRILOSEC Take 40 mg by mouth daily.   thiamine 100 MG tablet Take 1 tablet (100 mg total) by mouth daily.       Follow-up Information    [place]. Schedule an appointment as soon as possible for a visit in 1 week.   Specialty: Neurosurgery Contact information: [address] [place] [address] [phone]              No Known Allergies      The results of significant diagnostics from this hospitalization (including imaging, microbiology, ancillary and laboratory) are listed below for reference.     Microbiology: Recent Results (from the past 240 hour(s))  Respiratory Panel by RT PCR (Flu A&B, Covid) - Nasopharyngeal Swab     Status: None   Collection Time: 12/02/19  9:40 PM   Specimen: Nasopharyngeal Swab  Result Value Ref Range Status   SARS Coronavirus 2 by RT PCR NEGATIVE NEGATIVE Final    Comment: (NOTE) SARS-CoV-2 target nucleic acids are NOT DETECTED.  The SARS-CoV-2 RNA is generally detectable in upper respiratoy specimens during the acute phase of infection. The lowest concentration of SARS-CoV-2 viral copies this assay can detect is 131  copies/mL. A negative result does not preclude SARS-Cov-2 infection and should not be used as the sole basis for treatment or other patient management decisions. A negative result may occur with  improper specimen collection/handling, submission of specimen other than nasopharyngeal swab, presence of viral mutation(s) within the areas targeted by this assay, and inadequate number of viral copies (<131 copies/mL). A negative result must be combined with clinical observations, patient history, and epidemiological information. The expected result is Negative.  Fact Sheet for Patients:  PinkCheek.be  Fact Sheet for Healthcare Providers:   GravelBags.it  This test is no t yet approved or cleared by the [place] FDA and  has been authorized for detection and/or diagnosis of SARS-CoV-2 by FDA under an Emergency Use Authorization (EUA). This EUA will remain  in effect (meaning this test can be used) for the duration of the COVID-19 declaration under Section 564(b)(1) of the Act, 21 U.S.C. section 360bbb-3(b)(1), unless the authorization is terminated or revoked sooner.     Influenza A by PCR NEGATIVE NEGATIVE Final   Influenza B by PCR NEGATIVE NEGATIVE Final    Comment: (NOTE) The Xpert Xpress SARS-CoV-2/FLU/RSV assay is intended as an aid in  the diagnosis of influenza from Nasopharyngeal swab specimens and  should not be used as a sole basis for treatment. Nasal washings and  aspirates are unacceptable for Xpert Xpress SARS-CoV-2/FLU/RSV  testing.  Fact Sheet for Patients: PinkCheek.be  Fact Sheet for Healthcare Providers: GravelBags.it  This test is not yet approved or cleared by the [place] FDA and  has been authorized for detection and/or diagnosis of SARS-CoV-2 by  FDA under an Emergency Use Authorization (EUA). This EUA will remain  in effect (meaning this test can be used) for the duration of the  Covid-19 declaration under Section 564(b)(1) of the Act, 21  U.S.C. section 360bbb-3(b)(1), unless the authorization is  terminated or revoked. Performed at [facility], [address] [place], [place] [address]      Labs: BNP (last 3 results) No results for input(s): BNP in the last 8760 hours. Basic Metabolic Panel: Recent Labs  Lab 12/02/19 1053 12/03/19 0628  NA 142 137  K 4.0 3.4*  CL 109 104  CO2 24 24  GLUCOSE 103* 82  BUN 8 6  CREATININE 0.83 0.72  CALCIUM 8.8* 8.6*  MG  --  2.2   Liver Function Tests: Recent Labs  Lab 12/03/19 0628  AST 89*  ALT 26  ALKPHOS 60  BILITOT 0.7  PROT 6.3*   ALBUMIN 3.4*   No results for input(s): LIPASE, AMYLASE in the last 168 hours. No results for input(s): AMMONIA in the last 168 hours. CBC: Recent Labs  Lab 12/02/19 1053 12/03/19 0628  WBC 10.8* 7.2  NEUTROABS 9.2*  --   HGB 11.6* 10.7*  HCT 34.7* 31.2*  MCV 100.9* 99.0  PLT 185 167   Cardiac Enzymes: No results for input(s): CKTOTAL, CKMB, CKMBINDEX, TROPONINI in the last 168 hours. BNP: Invalid input(s): POCBNP CBG: Recent Labs  Lab 12/02/19 1149  GLUCAP 116*   D-Dimer No results for input(s): DDIMER in the last 72 hours. Hgb A1c No results for input(s): HGBA1C in the last 72 hours. Lipid Profile No results for input(s): CHOL, HDL, LDLCALC, TRIG, CHOLHDL, LDLDIRECT in the last 72 hours. Thyroid function studies No results for input(s): TSH, T4TOTAL, T3FREE, THYROIDAB in the last 72 hours.  Invalid input(s): FREET3 Anemia work up Recent Labs    12/03/19 [place]  Urinalysis    Component Value Date/Time   COLORURINE STRAW (A) 12/03/2019 2038   APPEARANCEUR CLEAR 12/03/2019 2038   LABSPEC 1.003 (L) 12/03/2019 2038   PHURINE 5.0 12/03/2019 2038   GLUCOSEU NEGATIVE 12/03/2019 2038   HGBUR SMALL (A) 12/03/2019 2038   BILIRUBINUR NEGATIVE 12/03/2019 2038   KETONESUR 5 (A) 12/03/2019 2038   PROTEINUR NEGATIVE 12/03/2019 2038   UROBILINOGEN 0.2 10/01/2014 2113   NITRITE NEGATIVE 12/03/2019 2038   LEUKOCYTESUR TRACE (A) 12/03/2019 2038   Sepsis Labs Invalid input(s): PROCALCITONIN,  WBC,  LACTICIDVEN Microbiology Recent Results (from the past 240 hour(s))  Respiratory Panel by RT PCR (Flu A&B, Covid) - Nasopharyngeal Swab     Status: None   Collection Time: 12/02/19  9:40 PM   Specimen: Nasopharyngeal Swab  Result Value Ref Range Status   SARS Coronavirus 2 by RT PCR NEGATIVE NEGATIVE Final    Comment: (NOTE) SARS-CoV-2 target nucleic acids are NOT DETECTED.  The SARS-CoV-2 RNA is generally detectable in upper respiratoy specimens during  the acute phase of infection. The lowest concentration of SARS-CoV-2 viral copies this assay can detect is 131 copies/mL. A negative result does not preclude SARS-Cov-2 infection and should not be used as the sole basis for treatment or other patient management decisions. A negative result may occur with  improper specimen collection/handling, submission of specimen other than nasopharyngeal swab, presence of viral mutation(s) within the areas targeted by this assay, and inadequate number of viral copies (<131 copies/mL). A negative result must be combined with clinical observations, patient history, and epidemiological information. The expected result is Negative.  Fact Sheet for Patients:  PinkCheek.be  Fact Sheet for Healthcare Providers:  GravelBags.it  This test is no t yet approved or cleared by the [place] FDA and  has been authorized for detection and/or diagnosis of SARS-CoV-2 by FDA under an Emergency Use Authorization (EUA). This EUA will remain  in effect (meaning this test can be used) for the duration of the COVID-19 declaration under Section 564(b)(1) of the Act, 21 U.S.C. section 360bbb-3(b)(1), unless the authorization is terminated or revoked sooner.     Influenza A by PCR NEGATIVE NEGATIVE Final   Influenza B by PCR NEGATIVE NEGATIVE Final    Comment: (NOTE) The Xpert Xpress SARS-CoV-2/FLU/RSV assay is intended as an aid in  the diagnosis of influenza from Nasopharyngeal swab specimens and  should not be used as a sole basis for treatment. Nasal washings and  aspirates are unacceptable for Xpert Xpress SARS-CoV-2/FLU/RSV  testing.  Fact Sheet for Patients: PinkCheek.be  Fact Sheet for Healthcare Providers: GravelBags.it  This test is not yet approved or cleared by the [place] FDA and  has been authorized for detection and/or  diagnosis of SARS-CoV-2 by  FDA under an Emergency Use Authorization (EUA). This EUA will remain  in effect (meaning this test can be used) for the duration of the  Covid-19 declaration under Section 564(b)(1) of the Act, 21  U.S.C. section 360bbb-3(b)(1), unless the authorization is  terminated or revoked. Performed at [facility], [address], [place], [place] [address]      Time coordinating discharge: Over 30 minutes  SIGNED:   Desiree Hane, MD  [facility] 12/04/2019, 4:50 PM Pager   If 7PM-7AM, please contact night-coverage [email or web address] Password [email or web address]

## 2019-12-04 NOTE — Evaluation (Signed)
Physical Therapy Evaluation Patient Details Name: Molly Murillo MRN: [id number] DOB: Feb 16, 1971 Today's Date: 12/04/2019   History of Present Illness  Molly Murillo  is a 49 y.o. female, without significant past medical history, she presents to ED secondary to complaints of seizures, boyfriend at bedside provide for the history, patient had witnessed seizures, she had 2 consecutive episodes earlier this morning, of tonic-clonic seizures, each episode lasting about 1 minute, as well she had another 2 episodes witnessed by EMS, lasting about 45 seconds each, she did receive 2.5 mg of Versed by EMS prior to arrival, patient had tongue biting/tongue laceration, no history of alcohol abuse, polysubstance abuse (she tested positive for cocaine and THC, but she denies cocaine use!!),  Report yesterday she did drink 2 beers, but none today, patient had ED visit x2 in June and July for syncope, but no history of seizures in the past,.    Clinical Impression  Patient functioning near baseline for functional mobility and gait but is slightly limited with ambulation today. Patient does not requires assist for bed mobility and demonstrates good sitting tolerance and normal sitting balance EOB. Patient transfers to standing without AD and is minimally unsteady upon initial standing. Patient ambulates with decreased cadence from baseline with slightly unsteady gait initially which improves as stiffness resolves. Patient ends session seated in chair and is educated on calling for assistance if she needs to get up. Patient discharged to care of nursing for ambulation daily as tolerated for length of stay.      Follow Up Recommendations No PT follow up    Equipment Recommendations  None recommended by PT    Recommendations for Other Services       Precautions / Restrictions Precautions Precautions: Fall Restrictions Weight Bearing Restrictions: No      Mobility  Bed Mobility Overal bed mobility:  Modified Independent             General bed mobility comments: to transfer to seated EOB  Transfers Overall transfer level: Needs assistance Equipment used: None Transfers: Sit to/from [employer] Transfers Sit to Stand: Supervision Stand pivot transfers: Supervision       General transfer comment: transfer to standing without AD, min unsteadiness upon standing but does not require physical assist  Ambulation/Gait Ambulation/Gait assistance: Supervision Gait Distance (Feet): 100 Feet Assistive device: None Gait Pattern/deviations: Decreased stride length;Decreased step length - right;Decreased step length - left Gait velocity: decreased from baseline   General Gait Details: slightly unsteady initially, does not require assist for balance  Stairs            Wheelchair Mobility    Modified Rankin (Stroke Patients Only)       Balance Overall balance assessment: Needs assistance   Sitting balance-Leahy Scale: Normal Sitting balance - Comments: seated EOB   Standing balance support: No upper extremity supported Standing balance-Leahy Scale: Good Standing balance comment: good/fair without AD                             Pertinent Vitals/Pain Pain Assessment: 0-10 Pain Score: 5  Pain Location: legs, elbows, tongue Pain Descriptors / Indicators: Aching Pain Intervention(s): Limited activity within patient's tolerance;Monitored during session    Home Living Family/patient expects to be discharged to:: Private residence Living Arrangements: Alone Available Help at Discharge: Family Type of Home: Apartment Home Access: Level entry     Home Layout: One level Home Equipment: None  Prior Function Level of Independence: Independent         Comments: Patient states independent with ADL, community ambulator     Hand Dominance        Extremity/Trunk Assessment   Upper Extremity Assessment Upper Extremity Assessment: Overall  WFL for tasks assessed    Lower Extremity Assessment Lower Extremity Assessment: Overall WFL for tasks assessed       Communication   Communication: No difficulties  Cognition Arousal/Alertness: Awake/alert Behavior During Therapy: WFL for tasks assessed/performed Overall Cognitive Status: Within Functional Limits for tasks assessed                                        General Comments      Exercises     Assessment/Plan    PT Assessment Patent does not need any further PT services  PT Problem List         PT Treatment Interventions      PT Goals (Current goals can be found in the Care Plan section)  Acute Rehab PT Goals Patient Stated Goal: Return home PT Goal Formulation: With patient Time For Goal Achievement: 12/04/19    Frequency     Barriers to discharge        Co-evaluation               AM-PAC PT "6 Clicks" Mobility  Outcome Measure Help needed turning from your back to your side while in a flat bed without using bedrails?: None Help needed moving from lying on your back to sitting on the side of a flat bed without using bedrails?: None Help needed moving to and from a bed to a chair (including a wheelchair)?: None Help needed standing up from a chair using your arms (e.g., wheelchair or bedside chair)?: None Help needed to walk in hospital room?: A Little Help needed climbing 3-5 steps with a railing? : A Little 6 Click Score: 22    End of Session Equipment Utilized During Treatment: Gait belt Activity Tolerance: Patient tolerated treatment well Patient left: in chair;with call bell/phone within reach Nurse Communication: Mobility status PT Visit Diagnosis: Unsteadiness on feet (R26.81);Other abnormalities of gait and mobility (R26.89);Muscle weakness (generalized) (M62.81)    Time: 9622-2979 PT Time Calculation (min) (ACUTE ONLY): 16 min   Charges:   PT Evaluation $PT Eval Low Complexity: 1 Low PT  Treatments $Therapeutic Activity: 8-22 mins       9:01 AM, 12/04/19 Wyman Songster PT, DPT Physical Therapist at [facility]

## 2019-12-04 NOTE — Discharge Instructions (Signed)
INSTRUCTIONS Seizure precaution including avoiding operating machinery or driving is recommended.  Other precaution includes avoiding swimming, taking baths and heights.   Please call [facility] to make follow up appointment for the cyst in your brain. [phone]    Seizure, Adult A seizure is a sudden burst of abnormal electrical activity in the brain. Seizures usually last from 30 seconds to 2 minutes. They can cause many different symptoms. Usually, seizures are not harmful unless they last a long time. What are the causes? Common causes of this condition include:  Fever or infection.  Conditions that affect the brain, such as: ? A brain abnormality that you were born with. ? A brain or head injury. ? Bleeding in the brain. ? A tumor. ? Stroke. ? Brain disorders such as autism or cerebral palsy.  Low blood sugar.  Conditions that are passed from parent to child (are inherited).  Problems with substances, such as: ? Having a reaction to a drug or a medicine. ? Suddenly stopping the use of a substance (withdrawal). In some cases, the cause may not be known. A person who has repeated seizures over time without a clear cause has a condition called epilepsy. What increases the risk? You are more likely to get this condition if you have:  A family history of epilepsy.  Had a seizure in the past.  A brain disorder.  A history of head injury, lack of oxygen at birth, or strokes. What are the signs or symptoms? There are many types of seizures. The symptoms vary depending on the type of seizure you have. Examples of symptoms during a seizure include:  Shaking (convulsions).  Stiffness in the body.  Passing out (losing consciousness).  Head nodding.  Staring.  Not responding to sound or touch.  Loss of bladder control and bowel control. Some people have symptoms right before and right after a seizure happens. Symptoms before a seizure may  include:  Fear.  Worry (anxiety).  Feeling like you may vomit (nauseous).  Feeling like the room is spinning (vertigo).  Feeling like you saw or heard something before (dj vu).  Odd tastes or smells.  Changes in how you see. You may see flashing lights or spots. Symptoms after a seizure happens can include:  Confusion.  Sleepiness.  Headache.  Weakness on one side of the body. How is this treated? Most seizures will stop on their own in under 5 minutes. In these cases, no treatment is needed. Seizures that last longer than 5 minutes will usually need treatment. Treatment can include:  Medicines given through an IV tube.  Avoiding things that are known to cause your seizures. These can include medicines that you take for another condition.  Medicines to treat epilepsy.  Surgery to stop the seizures. This may be needed if medicines do not help. Follow these instructions at home: Medicines  Take over-the-counter and prescription medicines only as told by your doctor.  Do not eat or drink anything that may keep your medicine from working, such as alcohol. Activity  Do not do any activities that would be dangerous if you had another seizure, like driving or swimming. Wait until your doctor says it is safe for you to do them.  If you live in the [place] ask your local DMV (department of motor vehicles) when you can drive.  Get plenty of rest. Teaching others Teach friends and family what to do when you have a seizure. They should:  Lay you on the ground.  Protect your head and body.  Loosen any tight clothing around your neck.  Turn you on your side.  Not hold you down.  Not put anything into your mouth.  Know whether or not you need emergency care.  Stay with you until you are better.  General instructions  Contact your doctor each time you have a seizure.  Avoid anything that gives you seizures.  Keep a seizure diary. Write down: ? What you think  caused each seizure. ? What you remember about each seizure.  Keep all follow-up visits as told by your doctor. This is important. Contact a doctor if:  You have another seizure.  You have seizures more often.  There is any change in what happens during your seizures.  You keep having seizures with treatment.  You have symptoms of being sick or having an infection. Get help right away if:  You have a seizure that: ? Lasts longer than 5 minutes. ? Is different than seizures you had before. ? Makes it harder to breathe. ? Happens after you hurt your head.  You have any of these symptoms after a seizure: ? Not being [place] to speak. ? Not being Donnell to use a part of your body. ? Confusion. ? A bad headache.  You have two or more seizures in a row.  You do not wake up right after a seizure.  You get hurt during a seizure. These symptoms may be an emergency. Do not wait to see if the symptoms will go away. Get medical help right away. Call your local emergency services (911 in the [place] Do not drive yourself to the hospital. Summary  Seizures usually last from 30 seconds to 2 minutes. Usually, they are not harmful unless they last a long time.  Do not eat or drink anything that may keep your medicine from working, such as alcohol.  Teach friends and family what to do when you have a seizure.  Contact your doctor each time you have a seizure. This information is not intended to replace advice given to you by your health care provider. Make sure you discuss any questions you have with your health care provider. Document Revised: 04/25/2018 Document Reviewed: 04/25/2018 Elsevier Patient Education  2020 [employer].   Seizure, Adult A seizure is a sudden burst of abnormal electrical activity in the brain. Seizures usually last from 30 seconds to 2 minutes. They can cause many different symptoms. Usually, seizures are not harmful unless they last a long time. What are the  causes? Common causes of this condition include:  Fever or infection.  Conditions that affect the brain, such as: ? A brain abnormality that you were born with. ? A brain or head injury. ? Bleeding in the brain. ? A tumor. ? Stroke. ? Brain disorders such as autism or cerebral palsy.  Low blood sugar.  Conditions that are passed from parent to child (are inherited).  Problems with substances, such as: ? Having a reaction to a drug or a medicine. ? Suddenly stopping the use of a substance (withdrawal). In some cases, the cause may not be known. A person who has repeated seizures over time without a clear cause has a condition called epilepsy. What increases the risk? You are more likely to get this condition if you have:  A family history of epilepsy.  Had a seizure in the past.  A brain disorder.  A history of head injury, lack of oxygen at birth, or strokes. What are  the signs or symptoms? There are many types of seizures. The symptoms vary depending on the type of seizure you have. Examples of symptoms during a seizure include:  Shaking (convulsions).  Stiffness in the body.  Passing out (losing consciousness).  Head nodding.  Staring.  Not responding to sound or touch.  Loss of bladder control and bowel control. Some people have symptoms right before and right after a seizure happens. Symptoms before a seizure may include:  Fear.  Worry (anxiety).  Feeling like you may vomit (nauseous).  Feeling like the room is spinning (vertigo).  Feeling like you saw or heard something before (dj vu).  Odd tastes or smells.  Changes in how you see. You may see flashing lights or spots. Symptoms after a seizure happens can include:  Confusion.  Sleepiness.  Headache.  Weakness on one side of the body. How is this treated? Most seizures will stop on their own in under 5 minutes. In these cases, no treatment is needed. Seizures that last longer than 5  minutes will usually need treatment. Treatment can include:  Medicines given through an IV tube.  Avoiding things that are known to cause your seizures. These can include medicines that you take for another condition.  Medicines to treat epilepsy.  Surgery to stop the seizures. This may be needed if medicines do not help. Follow these instructions at home: Medicines  Take over-the-counter and prescription medicines only as told by your doctor.  Do not eat or drink anything that may keep your medicine from working, such as alcohol. Activity  Do not do any activities that would be dangerous if you had another seizure, like driving or swimming. Wait until your doctor says it is safe for you to do them.  If you live in the [place] ask your local DMV (department of motor vehicles) when you can drive.  Get plenty of rest. Teaching others Teach friends and family what to do when you have a seizure. They should:  Lay you on the ground.  Protect your head and body.  Loosen any tight clothing around your neck.  Turn you on your side.  Not hold you down.  Not put anything into your mouth.  Know whether or not you need emergency care.  Stay with you until you are better.  General instructions  Contact your doctor each time you have a seizure.  Avoid anything that gives you seizures.  Keep a seizure diary. Write down: ? What you think caused each seizure. ? What you remember about each seizure.  Keep all follow-up visits as told by your doctor. This is important. Contact a doctor if:  You have another seizure.  You have seizures more often.  There is any change in what happens during your seizures.  You keep having seizures with treatment.  You have symptoms of being sick or having an infection. Get help right away if:  You have a seizure that: ? Lasts longer than 5 minutes. ? Is different than seizures you had before. ? Makes it harder to breathe. ? Happens  after you hurt your head.  You have any of these symptoms after a seizure: ? Not being [place] to speak. ? Not being Gas[place] to use a part of your body. ? Confusion. ? A bad headache.  You have two or more seizures in a row.  You do not wake up right after a seizure.  You get hurt during a seizure. These symptoms may be an emergency. Do  not wait to see if the symptoms will go away. Get medical help right away. Call your local emergency services (911 in the [place] Do not drive yourself to the hospital. Summary  Seizures usually last from 30 seconds to 2 minutes. Usually, they are not harmful unless they last a long time.  Do not eat or drink anything that may keep your medicine from working, such as alcohol.  Teach friends and family what to do when you have a seizure.  Contact your doctor each time you have a seizure. This information is not intended to replace advice given to you by your health care provider. Make sure you discuss any questions you have with your health care provider. Document Revised: 04/25/2018 Document Reviewed: 04/25/2018 Elsevier Patient Education  2020 [employer].

## 2019-12-15 DIAGNOSIS — R519 Headache, unspecified: Secondary | ICD-10-CM | POA: Insufficient documentation

## 2019-12-15 DIAGNOSIS — D332 Benign neoplasm of brain, unspecified: Secondary | ICD-10-CM | POA: Insufficient documentation

## 2019-12-15 DIAGNOSIS — D509 Iron deficiency anemia, unspecified: Secondary | ICD-10-CM | POA: Insufficient documentation

## 2020-02-09 ENCOUNTER — Other Ambulatory Visit (HOSPITAL_BASED_OUTPATIENT_CLINIC_OR_DEPARTMENT_OTHER): Payer: Self-pay | Admitting: Neurosurgery

## 2020-02-09 DIAGNOSIS — G930 Cerebral cysts: Secondary | ICD-10-CM

## 2020-03-11 ENCOUNTER — Other Ambulatory Visit: Payer: Self-pay

## 2020-03-11 ENCOUNTER — Ambulatory Visit (HOSPITAL_BASED_OUTPATIENT_CLINIC_OR_DEPARTMENT_OTHER)
Admission: RE | Admit: 2020-03-11 | Discharge: 2020-03-11 | Disposition: A | Discharge status: Home / Self Care | Payer: 59 | Source: Ambulatory Visit | Attending: Neurosurgery | Admitting: Neurosurgery

## 2020-03-11 DIAGNOSIS — G930 Cerebral cysts: Secondary | ICD-10-CM | POA: Insufficient documentation

## 2020-05-27 ENCOUNTER — Other Ambulatory Visit (HOSPITAL_BASED_OUTPATIENT_CLINIC_OR_DEPARTMENT_OTHER): Payer: Self-pay | Admitting: Neurosurgery

## 2020-05-27 ENCOUNTER — Other Ambulatory Visit: Payer: Self-pay

## 2020-05-27 ENCOUNTER — Ambulatory Visit (HOSPITAL_BASED_OUTPATIENT_CLINIC_OR_DEPARTMENT_OTHER)
Admission: RE | Admit: 2020-05-27 | Discharge: 2020-05-27 | Disposition: A | Discharge status: Home / Self Care | Payer: 59 | Source: Ambulatory Visit | Attending: Neurosurgery | Admitting: Neurosurgery

## 2020-05-27 DIAGNOSIS — G930 Cerebral cysts: Secondary | ICD-10-CM | POA: Insufficient documentation

## 2020-08-16 DIAGNOSIS — G40909 Epilepsy, unspecified, not intractable, without status epilepticus: Secondary | ICD-10-CM | POA: Insufficient documentation

## 2020-10-31 ENCOUNTER — Other Ambulatory Visit (HOSPITAL_BASED_OUTPATIENT_CLINIC_OR_DEPARTMENT_OTHER): Payer: Self-pay | Admitting: Neurosurgery

## 2020-10-31 DIAGNOSIS — G930 Cerebral cysts: Secondary | ICD-10-CM

## 2020-11-01 ENCOUNTER — Other Ambulatory Visit: Payer: Self-pay

## 2020-11-01 ENCOUNTER — Ambulatory Visit (HOSPITAL_BASED_OUTPATIENT_CLINIC_OR_DEPARTMENT_OTHER)
Admission: RE | Admit: 2020-11-01 | Discharge: 2020-11-01 | Disposition: A | Discharge status: Home / Self Care | Payer: 59 | Source: Ambulatory Visit | Attending: Neurosurgery | Admitting: Neurosurgery

## 2020-11-01 DIAGNOSIS — G930 Cerebral cysts: Secondary | ICD-10-CM

## 2021-04-07 ENCOUNTER — Ambulatory Visit: Payer: Self-pay | Admitting: Neurology

## 2021-04-07 ENCOUNTER — Encounter: Payer: Self-pay | Admitting: Neurology

## 2021-05-03 ENCOUNTER — Ambulatory Visit: Payer: 59 | Admitting: Neurology

## 2021-05-03 ENCOUNTER — Encounter: Payer: Self-pay | Admitting: Neurology

## 2021-05-03 VITALS — BP 137/80 | HR 98 | Ht 66.0 in | Wt 152.0 lb

## 2021-05-03 DIAGNOSIS — Z5181 Encounter for therapeutic drug level monitoring: Secondary | ICD-10-CM | POA: Diagnosis not present

## 2021-05-03 DIAGNOSIS — G40909 Epilepsy, unspecified, not intractable, without status epilepticus: Secondary | ICD-10-CM

## 2021-05-03 DIAGNOSIS — G930 Cerebral cysts: Secondary | ICD-10-CM

## 2021-05-03 MED ORDER — LEVETIRACETAM 750 MG PO TABS
750.0000 mg | ORAL_TABLET | Freq: Two times a day (BID) | ORAL | 4 refills | Status: DC
Start: 2021-05-03 — End: 2021-12-28

## 2021-05-03 NOTE — Progress Notes (Signed)
? ?[name] ? ?PATIENT: Molly Murillo ?DOB: 12-27-1970 ? ?REQUESTING CLINICIAN: Tamala Bari, DO ?HISTORY FROM: Patient  ?REASON FOR VISIT: Set up care for seizure disorder  ? ? ?HISTORICAL ? ?CHIEF COMPLAINT:  ?Chief Complaint  ?Patient presents with  ? New Patient (Initial Visit)  ?  Room 13 ?NP/Paper/[facility] [phone] disorder, epidermoid cyst of brain ?Pt here for transfer of care, states seizures are under control, c/o burning and tingling in finger tips and toes   ? ? ?HISTORY OF PRESENT ILLNESS:  ?This is a 51 year old woman past medical history of GERD and seizure disorder who is presenting to establish care.  Patient reports being diagnosed with seizure in October 2019.  During the work-up, she was found to have a suprasellar mass consistent with arachnoid cyst.  Since then she has been following with Dr. Christella Noa neurosurgery  with surveillance MRI.  Her latest MRI was in October 2022 and it was stable.  She is set up to see him again next October.  When it comes to the seizures.  She was started on Keppra, initially 500 mg twice daily but she did have breakthrough seizure in the setting of missing her Keppra dose and also continuing alcohol drinks.  Her last seizure was in July 2022.  At that time her Keppra was increased to 750 twice daily and since then she has not had any additional seizures.  She continues to drink alcohol and smokes cigarettes daily.  She denies any other seizure risk factors.  She has only tried levetiracetam so far.  Denies any injury, seizures. ? ? ?Handedness: Right handed ? ?Onset: October 2019 ? ?Seizure Type: Unclear possibly focal ? ?Current frequency: Last seizure July 2022 ? ?Any injuries from seizures: Denies ? ?Seizure risk factors: Denies, suprasellar mass ? ?Previous ASMs: Levetiracetam ? ?Currenty ASMs: Levetiracetam 750 mg BID  ? ?ASMs side effects: Denies ? ?Brain Images: 3 cm suprasellar mass consistent with  arachnoid cyst ? ?Previous EEGs: Per chart review she had a EEG in October 2021 and was reported as normal. ? ? ?OTHER MEDICAL CONDITIONS: GERD  ? ?REVIEW OF SYSTEMS: Full 14 system review of systems performed and negative with exception of: as noted in the HPI  ? ?ALLERGIES: ?No Known Allergies ? ?HOME MEDICATIONS: ?Outpatient Medications Prior to Visit  ?Medication Sig Dispense Refill  ? folic acid (FOLVITE) 1 MG tablet Take 1 tablet (1 mg total) by mouth daily. 45 tablet 0  ? omeprazole (PRILOSEC) 40 MG capsule Take 40 mg by mouth daily.    ? thiamine 100 MG tablet Take 1 tablet (100 mg total) by mouth daily. 45 tablet 0  ? levETIRAcetam (KEPPRA) 500 MG tablet Take 1 tablet (500 mg total) by mouth 2 (two) times daily. 45 tablet 1  ? ?No facility-administered medications prior to visit.  ? ? ?PAST MEDICAL HISTORY: ?Past Medical History:  ?Diagnosis Date  ? Narcotic overdose ([place])   ? Syncope   ? ? ?PAST SURGICAL HISTORY: ?Past Surgical History:  ?Procedure Laterality Date  ? LEEP    ? ? ?FAMILY HISTORY: ?Family History  ?Problem Relation Age of Onset  ? Diabetes Mother   ? ? ?SOCIAL HISTORY: ?Social History  ? ?Socioeconomic History  ? Marital status: Legally Separated  ?  Spouse name: Not on file  ? Number of children: Not on file  ? Years of education: Not on file  ? Highest education level: Not on file  ?Occupational History  ? Not  on file  ?Tobacco Use  ? Smoking status: Every Day  ?  Packs/day: 0.50  ?  Types: Cigarettes  ? Smokeless tobacco: Never  ?Vaping Use  ? Vaping Use: Former  ?Substance and Sexual Activity  ? Alcohol use: Yes  ?  Comment: 1-2 beers per day  ? Drug use: No  ? Sexual activity: Yes  ?  Birth control/protection: Condom  ?Other Topics Concern  ? Not on file  ?Social History Narrative  ? Not on file  ? ?Social Determinants of Health  ? ?Financial Resource Strain: Not on file  ?Food Insecurity: Not on file  ?Transportation Needs: Not on file  ?Physical Activity: Not on file  ?Stress: Not on  file  ?Social Connections: Not on file  ?Intimate Partner Violence: Not on file  ? ? ?PHYSICAL EXAM ? ?GENERAL EXAM/CONSTITUTIONAL: ?Vitals:  ?Vitals:  ? 05/03/21 1048  ?BP: 137/80  ?Pulse: 98  ?Weight: 152 lb (68.9 kg)  ?Height: 5\' 6"  (1.676 m)  ? ?Body mass index is 24.53 kg/m?. ?Wt Readings from Last 3 Encounters:  ?05/03/21 152 lb (68.9 kg)  ?12/03/19 149 lb 0.5 oz (67.6 kg)  ?07/26/19 153 lb (69.4 kg)  ? ?Patient is in no distress; well developed, nourished and groomed; neck is supple ? ?CARDIOVASCULAR: ?Examination of carotid arteries is normal; no carotid bruits ?Regular rate and rhythm, no murmurs ?Examination of peripheral vascular system by observation and palpation is normal ? ?EYES: ?Pupils round and reactive to light, Visual fields full to confrontation, Extraocular movements intacts,  ?No results found. ? ?MUSCULOSKELETAL: ?Gait, strength, tone, movements noted in Neurologic exam below ? ?NEUROLOGIC: ?MENTAL STATUS:  ?No flowsheet data found. ?awake, alert, oriented to person, place and time ?recent and remote memory intact ?normal attention and concentration ?language fluent, comprehension intact, naming intact ?fund of knowledge appropriate ? ?CRANIAL NERVE:  ?2nd, 3rd, 4th, 6th - pupils equal and reactive to light, visual fields full to confrontation, extraocular muscles intact, no nystagmus ?5th - facial sensation symmetric ?7th - facial strength symmetric ?8th - hearing intact ?9th - palate elevates symmetrically, uvula midline ?11th - shoulder shrug symmetric ?12th - tongue protrusion midline ? ?MOTOR:  ?normal bulk and tone, full strength in the BUE, BLE ? ?SENSORY:  ?normal and symmetric to light touch, pinprick, temperature, vibration ? ?COORDINATION:  ?finger-nose-finger, fine finger movements normal ? ?REFLEXES:  ?deep tendon reflexes present and symmetric ? ?GAIT/STATION:  ?normal ? ? ? ?DIAGNOSTIC DATA (LABS, IMAGING, TESTING) ?- I reviewed patient records, labs, notes, testing and imaging  myself where available. ? ?Lab Results  ?Component Value Date  ? WBC 7.2 12/03/2019  ? HGB 10.7 (L) 12/03/2019  ? HCT 31.2 (L) 12/03/2019  ? MCV 99.0 12/03/2019  ? PLT 167 12/03/2019  ? ?   ?Component Value Date/Time  ? Molly 137 12/03/2019 0628  ? K 3.4 (L) 12/03/2019 [id number]  ? CL 104 12/03/2019 0628  ? CO2 24 12/03/2019 0628  ? GLUCOSE 82 12/03/2019 0628  ? BUN 6 12/03/2019 0628  ? CREATININE 0.72 12/03/2019 0628  ? CALCIUM 8.6 (L) 12/03/2019 [id number]  ? PROT 6.3 (L) 12/03/2019 [id number]  ? ALBUMIN 3.4 (L) 12/03/2019 [id number]  ? AST 89 (H) 12/03/2019 [id number]  ? ALT 26 12/03/2019 0628  ? ALKPHOS 60 12/03/2019 0628  ? BILITOT 0.7 12/03/2019 0628  ? GFRNONAA >60 12/03/2019 [id number]  ? GFRAA >60 09/06/2019 1752  ? ?No results found for: CHOL, HDL, LDLCALC, LDLDIRECT, TRIG ?No results found for: HGBA1C ?Lab Results  ?  Component Value Date  ? [id number] 12/03/2019  ? ?No results found for: TSH ? ?CT Head 2022 ?1. Unchanged appearance of known suprasellar cistern epidermoid cyst. ?2. No acute intracranial process ? ? ?ASSESSMENT AND PLAN ? ?51 y.o. year old female  with History of GERD and seizure disorder who is presenting to establish care.  Her seizure was diagnosed in 2019 in the setting of suprasellar mass consistent with arachnoid cyst.  Since then, she is well maintained on Keppra, but had breakthrough seizure in the setting of missing her dose of Keppra.  She continues to drink alcohol daily, I have spent extended amount of time discussing the risk associated with drinking alcohol and seizure.  She voices understanding and reported she will start cutting down on alcohol use.  I will check a Keppra level and vitamin D level, continue the patient on Keppra 750 twice daily and I will see her in 6 months for follow-up.  Advised patient to contact me if she has breakthrough seizure.  She voices understanding. ? ? ?1. Seizure disorder ([place])   ?2. Arachnoid cyst   ?3. Therapeutic drug monitoring   ? ? ?Patient Instructions  ?Continue with  Keppra 750 mg twice daily  ?We will check a Keppra level and vitamin D level.  I will contact you to go over the result ?Follow up with Dr Christella Noa as schedule  ?Alcohol abstinence ?Return in 6 months  ? ? ?Pe

## 2021-05-03 NOTE — Patient Instructions (Addendum)
Continue with Keppra 750 mg twice daily  ?We will check a Keppra level and vitamin D level.  I will contact you to go over the result ?Follow up with Dr Franky Macho as schedule  ?Alcohol abstinence ?Return in 6 months  ?

## 2021-05-04 LAB — LEVETIRACETAM LEVEL: Levetiracetam Lvl: <2 ug/mL — ABNORMAL LOW (ref 10.0–40.0)

## 2021-05-04 LAB — VITAMIN D 25 HYDROXY (VIT D DEFICIENCY, FRACTURES): Vit D, 25-Hydroxy: 13 ng/mL — ABNORMAL LOW (ref 30.0–100.0)

## 2021-05-05 ENCOUNTER — Emergency Department (HOSPITAL_COMMUNITY)
Admission: EM | Admit: 2021-05-05 | Discharge: 2021-05-05 | Disposition: A | Discharge status: Home / Self Care | Payer: 59 | Attending: Emergency Medicine | Admitting: Emergency Medicine

## 2021-05-05 ENCOUNTER — Encounter (HOSPITAL_COMMUNITY): Payer: Self-pay | Admitting: *Deleted

## 2021-05-05 DIAGNOSIS — S00532A Contusion of oral cavity, initial encounter: Secondary | ICD-10-CM | POA: Insufficient documentation

## 2021-05-05 DIAGNOSIS — X58XXXA Exposure to other specified factors, initial encounter: Secondary | ICD-10-CM | POA: Diagnosis not present

## 2021-05-05 DIAGNOSIS — R569 Unspecified convulsions: Secondary | ICD-10-CM

## 2021-05-05 DIAGNOSIS — S0993XA Unspecified injury of face, initial encounter: Secondary | ICD-10-CM | POA: Diagnosis present

## 2021-05-05 DIAGNOSIS — R7401 Elevation of levels of liver transaminase levels: Secondary | ICD-10-CM | POA: Insufficient documentation

## 2021-05-05 DIAGNOSIS — G40909 Epilepsy, unspecified, not intractable, without status epilepticus: Secondary | ICD-10-CM | POA: Insufficient documentation

## 2021-05-05 HISTORY — DX: Unspecified convulsions: R56.9

## 2021-05-05 LAB — CBC WITH DIFFERENTIAL/PLATELET
Abs Immature Granulocytes: 0.04 10*3/uL (ref 0.00–0.07)
Basophils Absolute: 0.1 10*3/uL (ref 0.0–0.1)
Basophils Relative: 1 %
Eosinophils Absolute: 0 10*3/uL (ref 0.0–0.5)
Eosinophils Relative: 0 %
HCT: 36.1 % (ref 36.0–46.0)
Hemoglobin: 12.4 g/dL (ref 12.0–15.0)
Immature Granulocytes: 1 %
Lymphocytes Relative: 13 %
Lymphs Abs: 1 10*3/uL (ref 0.7–4.0)
MCH: 36.7 pg — ABNORMAL HIGH (ref 26.0–34.0)
MCHC: 34.3 g/dL (ref 30.0–36.0)
MCV: 106.8 fL — ABNORMAL HIGH (ref 80.0–100.0)
Monocytes Absolute: 0.9 10*3/uL (ref 0.1–1.0)
Monocytes Relative: 11 %
Neutro Abs: 5.8 10*3/uL (ref 1.7–7.7)
Neutrophils Relative %: 74 %
Platelets: 201 10*3/uL (ref 150–400)
RBC: 3.38 MIL/uL — ABNORMAL LOW (ref 3.87–5.11)
RDW: 14.3 % (ref 11.5–15.5)
WBC: 7.8 10*3/uL (ref 4.0–10.5)
nRBC: 0 % (ref 0.0–0.2)

## 2021-05-05 LAB — COMPREHENSIVE METABOLIC PANEL
ALT: 25 U/L (ref 0–44)
AST: 63 U/L — ABNORMAL HIGH (ref 15–41)
Albumin: 3.9 g/dL (ref 3.5–5.0)
Alkaline Phosphatase: 93 U/L (ref 38–126)
Anion gap: 12 (ref 5–15)
BUN: 7 mg/dL (ref 6–20)
CO2: 23 mmol/L (ref 22–32)
Calcium: 8.5 mg/dL — ABNORMAL LOW (ref 8.9–10.3)
Chloride: 100 mmol/L (ref 98–111)
Creatinine, Ser: 0.79 mg/dL (ref 0.44–1.00)
GFR, Estimated: >60 mL/min (ref >60)
Glucose, Bld: 77 mg/dL (ref 70–99)
Potassium: 3.9 mmol/L (ref 3.5–5.1)
Sodium: 135 mmol/L (ref 135–145)
Total Bilirubin: 0.7 mg/dL (ref 0.3–1.2)
Total Protein: 7 g/dL (ref 6.5–8.1)

## 2021-05-05 LAB — HCG, SERUM, QUALITATIVE: Preg, Serum: NEGATIVE

## 2021-05-05 MED ORDER — ACETAMINOPHEN 325 MG PO TABS
650.0000 mg | ORAL_TABLET | Freq: Once | ORAL | Status: AC
  Administered 2021-05-05: 650 mg via ORAL
  Filled 2021-05-05: qty 2

## 2021-05-05 MED ORDER — LIDOCAINE VISCOUS HCL 2 % MT SOLN: 5.0000 mL | Freq: Three times a day (TID) | OROMUCOSAL | 0 refills | Status: AC | PRN

## 2021-05-05 MED ORDER — LEVETIRACETAM IN NACL 500 MG/100ML IV SOLN
500.0000 mg | Freq: Once | INTRAVENOUS | Status: AC
  Administered 2021-05-05: 500 mg via INTRAVENOUS
  Filled 2021-05-05: qty 100

## 2021-05-05 NOTE — ED Triage Notes (Signed)
Had 2 seizures today, history of same and last known seizure activity July 2022. ?

## 2021-05-05 NOTE — ED Notes (Signed)
Dc instructions and scripts reviewed with pt no questions or concerns at this time.  

## 2021-05-05 NOTE — Discharge Instructions (Signed)
You were seen in the emergency department for evaluation of 2 seizures.  Your blood counts were normal.  You were given an extra dose of seizure medication.  Please continue your seizure medicine and follow-up with your neurologist.  It is state law that he cannot drive a car for at least 6 months after a seizure. ?

## 2021-05-05 NOTE — ED Provider Notes (Signed)
? EMERGENCY DEPARTMENT ?Provider Note ? ? ?CSN: [id number] ?Arrival date & time: 05/05/21  1150 ? ?  ? ?History ? ?Chief Complaint  ?Patient presents with  ? Seizures  ? ? ?Molly Murillo is a 51 y.o. female.  She has a history of seizure disorder.  She was asleep when she experienced 2 seizures.  Boyfriend was with her and it was [place] to tell me that the first 1 lasted about 3 minutes and then about 20 minutes later she had another 1 that lasted 2 minutes.  She is complaining of a little bit of a headache and a sore tongue from where she bit it.  Last seizure was about a year ago.  She is on Keppra and states has been taking it regularly although she thinks she may have missed a dose last evening.  No recent illness. ? ?The history is provided by the patient and a friend.  ?Seizures ?Seizure activity on arrival: no   ?Seizure type:  Grand mal ?Episode characteristics: generalized shaking and tongue biting   ?Return to baseline: yes   ?Duration:  3 minutes ?Timing:  Clustered ?Progression:  Resolved ?Recent head injury:  No recent head injuries ?PTA treatment:  None ?History of seizures: yes   ?Current therapy:  Levetiracetam ?Compliance with current therapy:  Good ? ?  ? ?Home Medications ?Prior to Admission medications   ?Medication Sig Start Date End Date Taking? Authorizing Provider  ?folic acid (FOLVITE) 1 MG tablet Take 1 tablet (1 mg total) by mouth daily. 12/04/19   Laverna Peace, MD  ?levETIRAcetam (KEPPRA) 750 MG tablet Take 1 tablet (750 mg total) by mouth 2 (two) times daily. 05/03/21 08/01/21  Windell Norfolk, MD  ?omeprazole (PRILOSEC) 40 MG capsule Take 40 mg by mouth daily. 11/13/19   [provider]  ?thiamine 100 MG tablet Take 1 tablet (100 mg total) by mouth daily. 12/04/19   Laverna Peace, MD  ?   ? ?Allergies    ?Patient has no known allergies.   ? ?Review of Systems   ?Review of Systems  ?Constitutional:  Negative for fever.  ?HENT:  Negative for sore throat.   ?Eyes:   Negative for visual disturbance.  ?Respiratory:  Negative for shortness of breath.   ?Cardiovascular:  Negative for chest pain.  ?Gastrointestinal:  Negative for abdominal pain.  ?Genitourinary:  Negative for dysuria.  ?Musculoskeletal:  Negative for neck pain.  ?Skin:  Negative for rash.  ?Neurological:  Positive for seizures and headaches.  ? ?Physical Exam ?Updated Vital Signs ?BP 113/78 (BP Location: Left Arm)   Pulse (!) 104   Temp 98.8 ?F (37.1 ?C) (Oral)   Resp 17   Ht 5\' 6"  (1.676 m)   Wt 72.6 kg   SpO2 100%   BMI 25.82 kg/m?  ?Physical Exam ?Vitals and nursing note reviewed.  ?Constitutional:   ?   General: She is not in acute distress. ?   Appearance: Normal appearance. She is well-developed.  ?HENT:  ?   Head: Normocephalic and atraumatic.  ?   Mouth/Throat:  ?   Mouth: Mucous membranes are moist.  ?   Pharynx: Oropharynx is clear.  ?   Comments: Tongue has some bruising on the left lateral edge ?Eyes:  ?   Conjunctiva/sclera: Conjunctivae normal.  ?Cardiovascular:  ?   Rate and Rhythm: Normal rate and regular rhythm.  ?   Heart sounds: No murmur heard. ?Pulmonary:  ?   Effort: Pulmonary effort is normal.  No respiratory distress.  ?   Breath sounds: Normal breath sounds.  ?Abdominal:  ?   Palpations: Abdomen is soft.  ?   Tenderness: There is no abdominal tenderness.  ?Musculoskeletal:     ?   General: No swelling.  ?   Cervical back: Neck supple.  ?Skin: ?   General: Skin is warm and dry.  ?   Capillary Refill: Capillary refill takes less than 2 seconds.  ?Neurological:  ?   General: No focal deficit present.  ?   Mental Status: She is alert.  ? ? ?ED Results / Procedures / Treatments   ?Labs ?(all labs ordered are listed, but only abnormal results are displayed) ?Labs Reviewed  ?COMPREHENSIVE METABOLIC PANEL - Abnormal; Notable for the following components:  ?    Result Value  ? Calcium 8.5 (*)   ? AST 63 (*)   ? All other components within normal limits  ?CBC WITH DIFFERENTIAL/PLATELET -  Abnormal; Notable for the following components:  ? RBC 3.38 (*)   ? MCV 106.8 (*)   ? MCH 36.7 (*)   ? All other components within normal limits  ?HCG, SERUM, QUALITATIVE  ?CBG MONITORING, ED  ? ? ?EKG ?EKG Interpretation ? ?Date/Time:  Friday May 05 2021 12:11:19 EDT ?Ventricular Rate:  105 ?PR Interval:  136 ?QRS Duration: 68 ?QT Interval:  354 ?QTC Calculation: 467 ?R Axis:   80 ?Text Interpretation: Sinus tachycardia Otherwise normal ECG When compared with ECG of 02-Dec-2019 09:39, No significant change since last tracing Confirmed by Meridee Score [phone]) on 05/05/2021 12:13:46 PM ? ?Radiology ?No results found. ? ?Procedures ?Procedures  ? ? ?Medications Ordered in ED ?Medications  ?levETIRAcetam (KEPPRA) IVPB 500 mg/100 mL premix (0 mg Intravenous Stopped 05/05/21 1307)  ?acetaminophen (TYLENOL) tablet 650 mg (650 mg Oral Given 05/05/21 1312)  ? ? ?ED Course/ Medical Decision Making/ A&P ?  ?                        ?Medical Decision Making ?Amount and/or Complexity of Data Reviewed ?Labs: ordered. ? ?Risk ?OTC drugs. ?Prescription drug management. ? ?This patient complains of generalized tonic-clonic seizure; this involves an extensive number of treatment ?Options and is a complaint that carries with it a high risk of complications and ?morbidity. The differential includes seizure, subtherapeutic anti convulsant, infection, metabolic derangement ? ?I ordered, reviewed and interpreted labs, which included CBC with normal white count normal hemoglobin, chemistries normal, LFTs with mild elevation of AST, pregnancy negative ?I ordered medication IV Keppra and oral Tylenol and reviewed PMP when indicated. ?Additional history obtained from patient's boyfriend who is with her when the seizure happened ?Previous records obtained and reviewed in epic no recent admissions ?Cardiac monitoring reviewed, normal sinus rhythm ?Social determinants considered, ongoing tobacco use ?Critical Interventions: None ? ?After the  interventions stated above, I reevaluated the patient and found patient to be hemodynamically stable and otherwise asymptomatic. ?Admission and further testing considered, no indications for admission at this time.  She has been IV loaded with Keppra.  Counseled to follow-up with her treating neurologist.  Return instructions discussed ? ? ? ? ? ? ? ? ? ?Final Clinical Impression(s) / ED Diagnoses ?Final diagnoses:  ?Seizure (HCC)  ? ? ?Rx / DC Orders ?ED Discharge Orders   ? ? None  ? ?  ? ? ?  ?Terrilee Files, MD ?05/05/21 1716 ? ?

## 2021-05-23 ENCOUNTER — Telehealth: Payer: Self-pay | Admitting: Neurology

## 2021-05-23 NOTE — Telephone Encounter (Signed)
I called and left her a message advising her to continue with Keprpa 750mg  BID as directed. We do not need to check a level now. She needs to be adherent with the Keppra  ?Last Keppra checked on 3/15 was undetected, then she had a seizure on 3/16.

## 2021-05-23 NOTE — Telephone Encounter (Signed)
Pt called wanting to know if she can come in today and get her Keppra Levels checked due to a seizure she had. Please advise.  ?

## 2021-05-23 NOTE — Telephone Encounter (Signed)
I called patient. She reports that she had a seizure on 3/16 and 3/17. She went to the ER. She reports that she has been dizzy and noticed some bruises on her body. She says that she's unsure if she's had another seizure since 3/17. She reports that she has missed a few doses of keppra recently. She wants to know if she can get her keppra level rechecked or if Dr. April Manson feels that she needs another visit. ?

## 2021-05-29 ENCOUNTER — Ambulatory Visit
Admission: EM | Admit: 2021-05-29 | Discharge: 2021-05-29 | Disposition: A | Discharge status: Home / Self Care | Payer: 59 | Attending: Urgent Care | Admitting: Urgent Care

## 2021-05-29 DIAGNOSIS — Z760 Encounter for issue of repeat prescription: Secondary | ICD-10-CM

## 2021-05-29 DIAGNOSIS — E559 Vitamin D deficiency, unspecified: Secondary | ICD-10-CM | POA: Diagnosis not present

## 2021-05-29 DIAGNOSIS — M25549 Pain in joints of unspecified hand: Secondary | ICD-10-CM | POA: Diagnosis not present

## 2021-05-29 MED ORDER — VITAMIN D 25 MCG (1000 UNIT) PO TABS: 1000.0000 [IU] | ORAL_TABLET | Freq: Every day | ORAL | 0 refills | Status: DC

## 2021-05-29 MED ORDER — NAPROXEN 375 MG PO TABS: 375.0000 mg | ORAL_TABLET | Freq: Two times a day (BID) | ORAL | 0 refills | Status: DC

## 2021-05-29 NOTE — ED Provider Notes (Signed)
?[facility] ? ? ?MRN: [id number] DOB: 01/14/1971 ? ?Subjective:  ? ?Molly Murillo is a 51 y.o. female presenting for medication refill of her vitamin D.  This was recently checked and she was advised to get back on it.  She does have a primary care visit coming up.  She also would like something for joint pains in her hands, wrists and forearms.  States that she has been trying to get her house together and has been very active.  Has used ibuprofen with very temporary relief.  No bruising, swelling, warmth, erythema, history of rheumatoid or gout. ? ?No current facility-administered medications for this encounter. ? ?Current Outpatient Medications:  ?  folic acid (FOLVITE) 1 MG tablet, Take 1 tablet (1 mg total) by mouth daily., Disp: 45 tablet, Rfl: 0 ?  levETIRAcetam (KEPPRA) 750 MG tablet, Take 1 tablet (750 mg total) by mouth 2 (two) times daily., Disp: 180 tablet, Rfl: 4 ?  magic mouthwash (lidocaine, diphenhydrAMINE, alum & mag hydroxide) suspension, Swish and spit 5 mLs 3 (three) times daily as needed for mouth pain., Disp: 360 mL, Rfl: 0 ?  omeprazole (PRILOSEC) 40 MG capsule, Take 40 mg by mouth daily., Disp: , Rfl:  ?  thiamine 100 MG tablet, Take 1 tablet (100 mg total) by mouth daily., Disp: 45 tablet, Rfl: 0  ? ?No Known Allergies ? ?Past Medical History:  ?Diagnosis Date  ? Narcotic overdose (HCC)   ? Seizures (HCC)   ? Syncope   ?  ? ?Past Surgical History:  ?Procedure Laterality Date  ? LEEP    ? ? ?Family History  ?Problem Relation Age of Onset  ? Diabetes Mother   ? ? ?Social History  ? ?Tobacco Use  ? Smoking status: Every Day  ?  Packs/day: 0.50  ?  Types: Cigarettes  ? Smokeless tobacco: Never  ?Vaping Use  ? Vaping Use: Former  ?Substance Use Topics  ? Alcohol use: Yes  ?  Comment: 1-2 beers per day  ? Drug use: No  ? ? ?ROS ? ? ?Objective:  ? ?Vitals: ?BP 124/81 (BP Location: Right Arm)   Pulse 87   Temp 97.9 ?F (36.6 ?C) (Oral)   Resp 16   LMP  (Within Weeks) Comment:  1 week  SpO2 96%  ? ?Physical Exam ?Constitutional:   ?   General: She is not in acute distress. ?   Appearance: Normal appearance. She is well-developed. She is not ill-appearing, toxic-appearing or diaphoretic.  ?HENT:  ?   Head: Normocephalic and atraumatic.  ?   Nose: Nose normal.  ?   Mouth/Throat:  ?   Mouth: Mucous membranes are moist.  ?Eyes:  ?   General: No scleral icterus.    ?   Right eye: No discharge.     ?   Left eye: No discharge.  ?   Extraocular Movements: Extraocular movements intact.  ?Cardiovascular:  ?   Rate and Rhythm: Normal rate.  ?Pulmonary:  ?   Effort: Pulmonary effort is normal.  ?Skin: ?   General: Skin is warm and dry.  ?Neurological:  ?   General: No focal deficit present.  ?   Mental Status: She is alert and oriented to person, place, and time.  ?Psychiatric:     ?   Mood and Affect: Mood normal.     ?   Behavior: Behavior normal.  ? ? ?Recent Results (from the past 2160 hour(s))  ?Levetiracetam level     Status:  Abnormal  ? Collection Time: 05/03/21 11:22 AM  ?Result Value Ref Range  ? Levetiracetam Lvl <2.0 (L) 10.0 - 40.0 ug/mL  ?Vitamin D, 25-hydroxy     Status: Abnormal  ? Collection Time: 05/03/21 11:22 AM  ?Result Value Ref Range  ? Vit D, 25-Hydroxy 13.0 (L) 30.0 - 100.0 ng/mL  ?  Comment: Vitamin D deficiency has been defined by the Institute of ?Medicine and an Endocrine Society practice guideline as a ?level of serum 25-OH vitamin D less than 20 ng/mL (1,2). ?The Endocrine Society went on to further define vitamin D ?insufficiency as a level between 21 and 29 ng/mL (2). ?1. IOM (Institute of Medicine). 2010. Dietary reference ?   intakes for calcium and D. [place]: The ?   [employer]. ?2. Holick MF, Binkley McIntosh, Bischoff-Ferrari HA, et al. ?   Evaluation, treatment, and prevention of vitamin D ?   deficiency: an Endocrine Society clinical practice ?   guideline. JCEM. 2011 Jul; 96(7):1911-30. ?  ?Comprehensive metabolic panel     Status: Abnormal  ?  Collection Time: 05/05/21 12:29 PM  ?Result Value Ref Range  ? Sodium 135 135 - 145 mmol/L  ? Potassium 3.9 3.5 - 5.1 mmol/L  ? Chloride 100 98 - 111 mmol/L  ? CO2 23 22 - 32 mmol/L  ? Glucose, Bld 77 70 - 99 mg/dL  ?  Comment: Glucose reference range applies only to samples taken after fasting for at least 8 hours.  ? BUN 7 6 - 20 mg/dL  ? Creatinine, Ser 0.79 0.44 - 1.00 mg/dL  ? Calcium 8.5 (L) 8.9 - 10.3 mg/dL  ? Total Protein 7.0 6.5 - 8.1 g/dL  ? Albumin 3.9 3.5 - 5.0 g/dL  ? AST 63 (H) 15 - 41 U/L  ? ALT 25 0 - 44 U/L  ? Alkaline Phosphatase 93 38 - 126 U/L  ? Total Bilirubin 0.7 0.3 - 1.2 mg/dL  ? GFR, Estimated >60 >60 mL/min  ?  Comment: (NOTE) ?Calculated using the CKD-EPI Creatinine Equation (2021) ?  ? Anion gap 12 5 - 15  ?  Comment: Performed at [facility], [address], [place], [place] [address]  ?CBC with Differential/Platelet     Status: Abnormal  ? Collection Time: 05/05/21 12:29 PM  ?Result Value Ref Range  ? WBC 7.8 4.0 - 10.5 K/uL  ? RBC 3.38 (L) 3.87 - 5.11 MIL/uL  ? Hemoglobin 12.4 12.0 - 15.0 g/dL  ? HCT 36.1 36.0 - 46.0 %  ? MCV 106.8 (H) 80.0 - 100.0 fL  ? MCH 36.7 (H) 26.0 - 34.0 pg  ? MCHC 34.3 30.0 - 36.0 g/dL  ? RDW 14.3 11.5 - 15.5 %  ? Platelets 201 150 - 400 K/uL  ? nRBC 0.0 0.0 - 0.2 %  ? Neutrophils Relative % 74 %  ? Neutro Abs 5.8 1.7 - 7.7 K/uL  ? Lymphocytes Relative 13 %  ? Lymphs Abs 1.0 0.7 - 4.0 K/uL  ? Monocytes Relative 11 %  ? Monocytes Absolute 0.9 0.1 - 1.0 K/uL  ? Eosinophils Relative 0 %  ? Eosinophils Absolute 0.0 0.0 - 0.5 K/uL  ? Basophils Relative 1 %  ? Basophils Absolute 0.1 0.0 - 0.1 K/uL  ? Immature Granulocytes 1 %  ? Abs Immature Granulocytes 0.04 0.00 - 0.07 K/uL  ?  Comment: Performed at [facility], [address], [place], [place] [address]  ?hCG, serum, qualitative     Status: None  ?  Collection Time: 05/05/21 12:53 PM  ?Result Value Ref Range  ? Preg, Serum NEGATIVE NEGATIVE  ?  Comment:        ?THE SENSITIVITY OF THIS ?METHODOLOGY IS >10  mIU/mL. ?Performed at [facility], [address] [place], [place] [id number] ?  ? ? ? ?Assessment and Plan :  ? ?PDMP not reviewed this encounter. ? ?1. Vitamin D deficiency   ?2. Medication refill   ?3. Pain in multiple finger joints   ? ?Recommended starting at 1000 units of vitamin D daily for vitamin D replacement.  Follow-up with PCP.  Offered her naproxen for pain and inflammation of her hands, finger joints.  No trauma or falls, deferred imaging as such.  Follow-up with PCP. Counseled patient on potential for adverse effects with medications prescribed/recommended today, ER and return-to-clinic precautions discussed, patient verbalized understanding. ? ?  ?Wallis BambergMani, Jacci Ruberg, PA-C ?05/29/21 1504 ? ?

## 2021-05-29 NOTE — ED Triage Notes (Signed)
Pt requested Vitamin D refill.  ? ?Pt reports she has some swelling and soreness in arms after she had a seizure 3 weeks ago, and wants something to help with the soreness.  ?

## 2021-06-23 ENCOUNTER — Ambulatory Visit (INDEPENDENT_AMBULATORY_CARE_PROVIDER_SITE_OTHER): Payer: 59 | Admitting: Family Medicine

## 2021-06-23 ENCOUNTER — Encounter: Payer: Self-pay | Admitting: Family Medicine

## 2021-06-23 VITALS — BP 122/78 | HR 74 | Ht 66.0 in | Wt 161.6 lb

## 2021-06-23 DIAGNOSIS — E519 Thiamine deficiency, unspecified: Secondary | ICD-10-CM

## 2021-06-23 DIAGNOSIS — H109 Unspecified conjunctivitis: Secondary | ICD-10-CM | POA: Diagnosis not present

## 2021-06-23 DIAGNOSIS — R7301 Impaired fasting glucose: Secondary | ICD-10-CM

## 2021-06-23 DIAGNOSIS — Z720 Tobacco use: Secondary | ICD-10-CM | POA: Diagnosis not present

## 2021-06-23 DIAGNOSIS — R569 Unspecified convulsions: Secondary | ICD-10-CM | POA: Diagnosis not present

## 2021-06-23 DIAGNOSIS — E785 Hyperlipidemia, unspecified: Secondary | ICD-10-CM | POA: Diagnosis not present

## 2021-06-23 MED ORDER — FOLIC ACID 1 MG PO TABS: 1.0000 mg | ORAL_TABLET | Freq: Every day | ORAL | 0 refills | Status: DC

## 2021-06-23 MED ORDER — OMEGA-3-ACID ETHYL ESTERS 1 G PO CAPS: 1.0000 g | ORAL_CAPSULE | Freq: Two times a day (BID) | ORAL | 2 refills | Status: DC

## 2021-06-23 MED ORDER — CIPROFLOXACIN HCL 0.3 % OP SOLN: OPHTHALMIC | 0 refills | Status: DC

## 2021-06-23 MED ORDER — THIAMINE HCL 100 MG PO TABS: 100.0000 mg | ORAL_TABLET | Freq: Every day | ORAL | 0 refills | Status: DC

## 2021-06-23 NOTE — Progress Notes (Addendum)
? ?New Patient Office Visit ? ?Subjective:  ?Patient ID: Molly Murillo, female    DOB: 08/08/1970  Age: 51 y.o. MRN: [id number] ? ?CC:  ?Chief Complaint  ?Patient presents with  ? New Patient (Initial Visit)  ?  Establishing care, [facility] [place]. Has concerns about vitamin b would like general work up with blood work.   ? ? ?HPI ?Molly Murillo is a 51 y.o. female with past medical history of seizures present for establishing care. She reported non-adherence to her medications starting Nov 2022 and had a seizure on May 05, 2021. She realized that her keppra levels were low along with her Vit B1 and Vit. D. She has followed up with neurology since, and her next appointment is in June 2023. ?She reports chronic bacterial conjunctivitis since childhood reporting matted eyes in the morning with a burning and scratching sensation in the left eye with drainage. She noted applying a cold rag over the affected eye with her eye drop solution and erythromycin ointment. She reports that she is out of eye drops.  ? ?She shares that she is trying to take better care of herself and is cutting back on smoking and drinking. She reports drinking one mixed drink or 16 oz of beer and smokes four cigarettes daily. ?  ?Past Medical History:  ?Diagnosis Date  ? Narcotic overdose (HCC)   ? Seizures (HCC)   ? Syncope   ? ? ?Past Surgical History:  ?Procedure Laterality Date  ? LEEP    ? ? ?Family History  ?Problem Relation Age of Onset  ? Diabetes Mother   ? ? ?Social History  ? ?Socioeconomic History  ? Marital status: Legally Separated  ?  Spouse name: Not on file  ? Number of children: Not on file  ? Years of education: Not on file  ? Highest education level: Not on file  ?Occupational History  ? Not on file  ?Tobacco Use  ? Smoking status: Every Day  ?  Packs/day: 0.50  ?  Types: Cigarettes  ? Smokeless tobacco: Never  ?Vaping Use  ? Vaping Use: Former  ?Substance and Sexual Activity  ? Alcohol  use: Yes  ?  Comment: 1-2 beers per day  ? Drug use: No  ? Sexual activity: Yes  ?  Birth control/protection: Condom  ?Other Topics Concern  ? Not on file  ?Social History Narrative  ? Not on file  ? ?Social Determinants of Health  ? ?Financial Resource Strain: Not on file  ?Food Insecurity: Not on file  ?Transportation Needs: Not on file  ?Physical Activity: Not on file  ?Stress: Not on file  ?Social Connections: Not on file  ?Intimate Partner Violence: Not on file  ? ? ?ROS ?Review of Systems  ?Constitutional:  Positive for chills (due to menopausal changes). Negative for fatigue and fever.  ?HENT:  Negative for congestion, sinus pressure, sinus pain, sneezing and sore throat.   ?Eyes:  Positive for discharge, redness and itching. Negative for pain and visual disturbance.  ?Respiratory:  Negative for choking and shortness of breath.   ?Gastrointestinal:  Negative for constipation, diarrhea, nausea and vomiting.  ?Endocrine: Negative for polydipsia, polyphagia and polyuria.  ?Genitourinary:  Negative for frequency and urgency.  ?Musculoskeletal:  Negative for back pain and neck pain.  ?Skin:  Negative for rash and wound.  ?Neurological:  Negative for dizziness, weakness and headaches.  ?Hematological:  Does not bruise/bleed easily.  ?Psychiatric/Behavioral:  Negative for  confusion, self-injury and suicidal ideas.   ? ?Objective:  ? ?Today's Vitals: BP 122/78   Pulse 74   Ht 5\' 6"  (1.676 m)   Wt 161 lb 9.6 oz (73.3 kg)   SpO2 99%   BMI 26.08 kg/m?  ? ?Physical Exam ?Constitutional:   ?   Appearance: Normal appearance.  ?HENT:  ?   Head: Normocephalic.  ?   Right Ear: External ear normal.  ?   Left Ear: External ear normal.  ?   Nose: No congestion or rhinorrhea.  ?   Mouth/Throat:  ?   Mouth: Mucous membranes are moist.  ?Eyes:  ?   General:     ?   Right eye: No discharge.     ?   Left eye: No discharge.  ?   Extraocular Movements: Extraocular movements intact.  ?   Pupils: Pupils are equal, round, and  reactive to light.  ?Cardiovascular:  ?   Rate and Rhythm: Normal rate and regular rhythm.  ?   Pulses: Normal pulses.  ?   Heart sounds: Normal heart sounds.  ?Pulmonary:  ?   Effort: Pulmonary effort is normal.  ?   Breath sounds: Normal breath sounds. No wheezing.  ?Abdominal:  ?   Palpations: Abdomen is soft.  ?   Tenderness: There is no abdominal tenderness.  ?Musculoskeletal:  ?   Cervical back: No rigidity.  ?   Right lower leg: No edema.  ?   Left lower leg: No edema.  ?Skin: ?   General: Skin is warm.  ?   Findings: No lesion or rash.  ?Neurological:  ?   Mental Status: She is alert and oriented to person, place, and time.  ?Psychiatric:  ?   Comments: Normal affect  ? ? ?Assessment & Plan:  ? ?Problem List Items Addressed This Visit   ? ?  ? Other  ? Seizure (HCC) - Primary  ?  Adhering to treatment regimen ?Following up with neurology in June 2023. ? ?  ?  ? Relevant Medications  ? folic acid (FOLVITE) 1 MG tablet  ? thiamine 100 MG tablet  ? Other Relevant Orders  ? TSH + free T4 (Completed)  ? Tobacco abuse  ?  Smokes about 6 cigarettes daily ? ?Asked about quitting: confirms that he/she currently smokes cigarettes ?Advise to quit smoking: Educated about QUITTING to reduce the risk of cancer, cardio and cerebrovascular disease. ?Assess willingness: Unwilling to quit at this time, but is working on cutting back. ?Assist with counseling and pharmacotherapy: Counseled for 5 minutes and literature provided. ?Arrange for follow up: follow up in 3 months and continue to offer help. ?  ?  ? Bacterial conjunctivitis of left eye  ?  Eye drop solution ordered ? ?  ?  ? Relevant Medications  ? ciprofloxacin (CILOXAN) 0.3 % ophthalmic solution  ? Vitamin B1 deficiency  ?  Pending labs ? ?  ?  ? Relevant Orders  ? Vitamin B1 (Completed)  ? ?Other Visit Diagnoses   ? ? IFG (impaired fasting glucose)      ? Relevant Orders  ? Hemoglobin A1C (Completed)  ? Dyslipidemia      ? Relevant Medications  ? omega-3 acid ethyl  esters (LOVAZA) 1 g capsule  ? Other Relevant Orders  ? Lipid panel (Completed)  ? ?  ? ? ?Outpatient Encounter Medications as of 06/23/2021  ?Medication Sig  ? cholecalciferol (VITAMIN D3) 25 MCG (1000 UNIT) tablet Take 1 tablet (1,000  Units total) by mouth daily.  ? ciprofloxacin (CILOXAN) 0.3 % ophthalmic solution Administer 1 drop every 4 hours  while awake for the next 5 days.  ? levETIRAcetam (KEPPRA) 750 MG tablet Take 1 tablet (750 mg total) by mouth 2 (two) times daily.  ? magic mouthwash (lidocaine, diphenhydrAMINE, alum & mag hydroxide) suspension Swish and spit 5 mLs 3 (three) times daily as needed for mouth pain.  ? naproxen (NAPROSYN) 375 MG tablet Take 1 tablet (375 mg total) by mouth 2 (two) times daily with a meal.  ? omeprazole (PRILOSEC) 40 MG capsule Take 40 mg by mouth daily.  ? [DISCONTINUED] folic acid (FOLVITE) 1 MG tablet Take 1 tablet (1 mg total) by mouth daily.  ? [DISCONTINUED] omega-3 acid ethyl esters (LOVAZA) 1 g capsule Take by mouth 2 (two) times daily.  ? folic acid (FOLVITE) 1 MG tablet Take 1 tablet (1 mg total) by mouth daily.  ? omega-3 acid ethyl esters (LOVAZA) 1 g capsule Take 1 capsule (1 g total) by mouth 2 (two) times daily.  ? thiamine 100 MG tablet Take 1 tablet (100 mg total) by mouth daily.  ? [DISCONTINUED] thiamine 100 MG tablet Take 1 tablet (100 mg total) by mouth daily. (Patient not taking: Reported on 06/23/2021)  ? ?No facility-administered encounter medications on file as of 06/23/2021.  ? ? ?Follow-up: Return in about 3 months (around 09/23/2021).  ? ?Gilmore Laroche, FNP ?

## 2021-06-23 NOTE — Patient Instructions (Addendum)
I appreciate the opportunity to provide care to you today! ?  ?Follow up: 3 months ? ? labs: please stop by the lab to get your blood drawn ? ?Refills: Folic acid, Thiamine, Lovaza ?  ?Please continue to work on alcohol and smoking cessation  ? ? ?Please pick up your prescription at the pharmacy (eye drop) ? ? ?  ?It was a pleasure to see you and I look forward to continuing to work together on your health and well-being. ?Please do not hesitate to call the office if you need care or have questions about your care. ?  ?Have a wonderful day and week. ?With Gratitude, ?Gilmore Laroche MSN, FNP-BC ? ?

## 2021-06-24 ENCOUNTER — Telehealth: Payer: Self-pay | Admitting: Family Medicine

## 2021-06-24 ENCOUNTER — Other Ambulatory Visit: Payer: Self-pay | Admitting: Family Medicine

## 2021-06-24 DIAGNOSIS — H109 Unspecified conjunctivitis: Secondary | ICD-10-CM | POA: Insufficient documentation

## 2021-06-24 DIAGNOSIS — E038 Other specified hypothyroidism: Secondary | ICD-10-CM

## 2021-06-24 DIAGNOSIS — E519 Thiamine deficiency, unspecified: Secondary | ICD-10-CM | POA: Insufficient documentation

## 2021-06-24 MED ORDER — LEVOTHYROXINE SODIUM 25 MCG PO TABS: 25.0000 ug | ORAL_TABLET | Freq: Every day | ORAL | 3 refills | Status: DC

## 2021-06-24 NOTE — Assessment & Plan Note (Signed)
Smokes about 6 cigarettes daily ? ?Asked about quitting: confirms that he/she currently smokes cigarettes ?Advise to quit smoking: Educated about QUITTING to reduce the risk of cancer, cardio and cerebrovascular disease. ?Assess willingness: Unwilling to quit at this time, but is working on cutting back. ?Assist with counseling and pharmacotherapy: Counseled for 5 minutes and literature provided. ?Arrange for follow up: follow up in 3 months and continue to offer help. ?

## 2021-06-24 NOTE — Assessment & Plan Note (Signed)
Adhering to treatment regimen ?Following up with neurology in June 2023. ?

## 2021-06-24 NOTE — Assessment & Plan Note (Signed)
Eye drop solution ordered ?

## 2021-06-24 NOTE — Assessment & Plan Note (Signed)
Pending labs

## 2021-06-24 NOTE — Progress Notes (Unsigned)
The 10-year ASCVD risk score (Arnett DK, et al., 2019) is: 1.6% ?  Values used to calculate the score: ?    Age: 51 years ?    Sex: Female ?    Is Non-Hispanic African American: Yes ?    Diabetic: No ?    Tobacco smoker: Yes ?    Systolic Blood Pressure: 122 mmHg ?    Is BP treated: No ?    HDL Cholesterol: 77 mg/dL ?    Total Cholesterol: 223 mg/dL  ?

## 2021-06-26 NOTE — Telephone Encounter (Signed)
Attempted to contact pt, unable to leave a message vm is full.  ?

## 2021-06-30 LAB — LIPID PANEL
Chol/HDL Ratio: 2.9 ratio (ref 0.0–4.4)
Cholesterol, Total: 223 mg/dL — ABNORMAL HIGH (ref 100–199)
HDL: 77 mg/dL (ref >39)
LDL Chol Calc (NIH): 125 mg/dL — ABNORMAL HIGH (ref 0–99)
Triglycerides: 121 mg/dL (ref 0–149)
VLDL Cholesterol Cal: 21 mg/dL (ref 5–40)

## 2021-06-30 LAB — VITAMIN B1: Thiamine: 76.2 nmol/L (ref 66.5–200.0)

## 2021-06-30 LAB — TSH+FREE T4
Free T4: 0.68 ng/dL — ABNORMAL LOW (ref 0.82–1.77)
TSH: 0.367 u[IU]/mL — ABNORMAL LOW (ref 0.450–4.500)

## 2021-06-30 LAB — HEMOGLOBIN A1C
Est. average glucose Bld gHb Est-mCnc: 94 mg/dL
Hgb A1c MFr Bld: 4.9 % (ref 4.8–5.6)

## 2021-07-03 ENCOUNTER — Other Ambulatory Visit: Payer: Self-pay | Admitting: Family Medicine

## 2021-07-03 NOTE — Progress Notes (Signed)
The 10-year ASCVD risk score (Arnett DK, et al., 2019) is: 1.6% ?  Values used to calculate the score: ?    Age: 50 years ?    Sex: Female ?    Is Non-Hispanic African American: Yes ?    Diabetic: No ?    Tobacco smoker: Yes ?    Systolic Blood Pressure: 122 mmHg ?    Is BP treated: No ?    HDL Cholesterol: 77 mg/dL ?    Total Cholesterol: 223 mg/dL  ?

## 2021-07-03 NOTE — Progress Notes (Signed)
Please inform the patient that her B1 levels are within normal limits

## 2021-07-18 ENCOUNTER — Ambulatory Visit: Payer: Self-pay | Admitting: Nurse Practitioner

## 2021-09-01 ENCOUNTER — Other Ambulatory Visit: Payer: Self-pay | Admitting: Family Medicine

## 2021-09-01 DIAGNOSIS — R569 Unspecified convulsions: Secondary | ICD-10-CM

## 2021-09-25 ENCOUNTER — Other Ambulatory Visit (HOSPITAL_COMMUNITY)
Admission: RE | Admit: 2021-09-25 | Discharge: 2021-09-25 | Disposition: A | Discharge status: Home / Self Care | Payer: 59 | Source: Ambulatory Visit | Attending: Family Medicine | Admitting: Family Medicine

## 2021-09-25 ENCOUNTER — Encounter: Payer: Self-pay | Admitting: Family Medicine

## 2021-09-25 ENCOUNTER — Ambulatory Visit (INDEPENDENT_AMBULATORY_CARE_PROVIDER_SITE_OTHER): Payer: 59 | Admitting: Family Medicine

## 2021-09-25 VITALS — BP 136/89 | HR 86 | Ht 66.0 in | Wt 162.0 lb

## 2021-09-25 DIAGNOSIS — R7301 Impaired fasting glucose: Secondary | ICD-10-CM | POA: Diagnosis not present

## 2021-09-25 DIAGNOSIS — Z124 Encounter for screening for malignant neoplasm of cervix: Secondary | ICD-10-CM | POA: Diagnosis not present

## 2021-09-25 DIAGNOSIS — N750 Cyst of Bartholin's gland: Secondary | ICD-10-CM | POA: Diagnosis not present

## 2021-09-25 DIAGNOSIS — E039 Hypothyroidism, unspecified: Secondary | ICD-10-CM | POA: Insufficient documentation

## 2021-09-25 DIAGNOSIS — N814 Uterovaginal prolapse, unspecified: Secondary | ICD-10-CM | POA: Diagnosis not present

## 2021-09-25 DIAGNOSIS — E038 Other specified hypothyroidism: Secondary | ICD-10-CM

## 2021-09-25 DIAGNOSIS — M25542 Pain in joints of left hand: Secondary | ICD-10-CM | POA: Diagnosis not present

## 2021-09-25 DIAGNOSIS — E559 Vitamin D deficiency, unspecified: Secondary | ICD-10-CM

## 2021-09-25 DIAGNOSIS — E785 Hyperlipidemia, unspecified: Secondary | ICD-10-CM | POA: Diagnosis not present

## 2021-09-25 DIAGNOSIS — M25541 Pain in joints of right hand: Secondary | ICD-10-CM | POA: Diagnosis not present

## 2021-09-25 MED ORDER — NAPROXEN 375 MG PO TABS: 375.0000 mg | ORAL_TABLET | Freq: Two times a day (BID) | ORAL | 0 refills | Status: DC

## 2021-09-25 NOTE — Assessment & Plan Note (Signed)
C/o of a painless lump near the vaginal opening on the right labia she notes intermittent swelling and redness No signs of inflammation noted  Referral placed to dermatology for [facility] if possible

## 2021-09-25 NOTE — Patient Instructions (Addendum)
I appreciate the opportunity to provide care to you today!    Follow up:  4 months  Labs: please stop by the lab during the week of 11/06/21 to get your blood drawn (CBC, CMP, TSH, Lipid profile, HgA1c, Vit D)   Referrals today- dermatology   Please continue to a heart-healthy diet and increase your physical activities. Try to exercise for at least three times a week.      It was a pleasure to see you and I look forward to continuing to work together on your health and well-being. Please do not hesitate to call the office if you need care or have questions about your care.   Have a wonderful day and week. With Gratitude, Gilmore Laroche MSN, FNP-BC

## 2021-09-25 NOTE — Assessment & Plan Note (Addendum)
Lab Results  Component Value Date   TSH 0.367 (L) 06/23/2021   -reported to have recently picked up her prescription and started taking her medicine 1 week ago -c/o of lower extremities edema with increased forgetfulness Inform pt that lower extremities edema and increased forgetfulness are not side effects of synthroid encouraged low sodium diet and leg elevation will check labs in 6 weeks

## 2021-09-25 NOTE — Assessment & Plan Note (Signed)
Reports to having uterine prolapse since he birth of her daughter Encouraged pelvic floor  muscle exercises

## 2021-09-25 NOTE — Assessment & Plan Note (Signed)
-   According to the [employer], cytology and HPV co-testing (preferred) every 5 years or cytology alone (acceptable) every 3 years. - Pap due 2026  

## 2021-09-25 NOTE — Progress Notes (Signed)
Established Patient Office Visit  Subjective:  Patient ID: Molly Murillo, female    DOB: 03-15-1970  Age: 51 y.o. MRN: [id number]  CC:  Chief Complaint  Patient presents with   Follow-up    Pt is following up states she has noticed side effects with thyroid medication would like to discuss, also would like a referral to dermatologist has skin problem on her face.     HPI Molly Murillo is a 51 y.o. female with past medical history of seizures presents for a pap smear. Batrtholin cyst: C/o of a painless lump near the vaginal opening on the right labia.she notes intermittent swelling and redness. Onset of symptoms was 3 years ago after shaving. Hypothyroidism: reported to have started synthroid 1 week ago. She reports increased forgetfulness and lower extremities swelling which she attributes to synthroid medications.  Past Medical History:  Diagnosis Date   Narcotic overdose ([place])    Seizures ([place])    Syncope     Past Surgical History:  Procedure Laterality Date   LEEP      Family History  Problem Relation Age of Onset   Diabetes Mother     Social History   Socioeconomic History   Marital status: Legally Separated    Spouse name: Not on file   Number of children: Not on file   Years of education: Not on file   Highest education level: Not on file  Occupational History   Not on file  Tobacco Use   Smoking status: Every Day    Packs/day: 0.50    Types: Cigarettes   Smokeless tobacco: Never  Vaping Use   Vaping Use: Former  Substance and Sexual Activity   Alcohol use: Yes    Comment: 1-2 beers per day   Drug use: No   Sexual activity: Yes    Birth control/protection: Condom  Other Topics Concern   Not on file  Social History Narrative   Not on file   Social Determinants of Health   Financial Resource Strain: Not on file  Food Insecurity: Not on file  Transportation Needs: Not on file  Physical Activity: Not on file  Stress: Not on file  Social  Connections: Not on file  Intimate Partner Violence: Not on file    Outpatient Medications Prior to Visit  Medication Sig Dispense Refill   cholecalciferol (VITAMIN D3) 25 MCG (1000 UNIT) tablet Take 1 tablet (1,000 Units total) by mouth daily. 90 tablet 0   ciprofloxacin (CILOXAN) 0.3 % ophthalmic solution Administer 1 drop every 4 hours  while awake for the next 5 days. 5 mL 0   folic acid (FOLVITE) 1 MG tablet Take 1 tablet by mouth once daily 45 tablet 0   levothyroxine (SYNTHROID) 25 MCG tablet Take 1 tablet (25 mcg total) by mouth daily. 90 tablet 3   magic mouthwash (lidocaine, diphenhydrAMINE, alum & mag hydroxide) suspension Swish and spit 5 mLs 3 (three) times daily as needed for mouth pain. 360 mL 0   omega-3 acid ethyl esters (LOVAZA) 1 g capsule Take 1 capsule (1 g total) by mouth 2 (two) times daily. 90 capsule 2   omeprazole (PRILOSEC) 40 MG capsule Take 40 mg by mouth daily.     thiamine 100 MG tablet Take 1 tablet (100 mg total) by mouth daily. 45 tablet 0   naproxen (NAPROSYN) 375 MG tablet Take 1 tablet (375 mg total) by mouth 2 (two) times daily with a meal. 30 tablet 0   levETIRAcetam (  KEPPRA) 750 MG tablet Take 1 tablet (750 mg total) by mouth 2 (two) times daily. 180 tablet 4   No facility-administered medications prior to visit.    No Known Allergies  ROS Review of Systems  Constitutional:  Negative for fatigue and fever.  Gastrointestinal:  Negative for diarrhea, nausea and vomiting.  Musculoskeletal:        Lower extremities edema  Skin:  Negative for rash and wound.       Vaginal lumps  Psychiatric/Behavioral:  Negative for confusion.       Objective:    Physical Exam HENT:     Head: Normocephalic.  Cardiovascular:     Rate and Rhythm: Normal rate and regular rhythm.     Pulses: Normal pulses.     Heart sounds: Normal heart sounds.  Genitourinary:    Exam position: Lithotomy position.     Tanner stage (genital): 5.     Labia:        Right:  Lesion present.      Comments: Pap done. Uterine prolapse noted. Vaginal wall: pink and rugated, smooth and non-tender; absence of lesions, edema, and erythema. Labia Majora and Minora: present bilaterally, moist, soft tissue, and homogeneous; free of edema and ulcerations. Clitoris is anatomically present, above the urethral, and free of lesions, masses, and ulceration.  Musculoskeletal:     Right lower leg: No edema.     Left lower leg: No edema.  Skin:    Comments: painless lump near the vaginal opening on the right labia  Neurological:     Mental Status: She is alert.     BP 136/89   Pulse 86   Ht '5\' 6"'  (1.676 m)   Wt 162 lb (73.5 kg)   SpO2 97%   BMI 26.15 kg/m  Wt Readings from Last 3 Encounters:  09/25/21 162 lb (73.5 kg)  06/23/21 161 lb 9.6 oz (73.3 kg)  05/05/21 160 lb (72.6 kg)    Lab Results  Component Value Date   TSH 0.367 (L) 06/23/2021   Lab Results  Component Value Date   WBC 7.8 05/05/2021   HGB 12.4 05/05/2021   HCT 36.1 05/05/2021   MCV 106.8 (H) 05/05/2021   PLT 201 05/05/2021   Lab Results  Component Value Date   NA 135 05/05/2021   K 3.9 05/05/2021   CO2 23 05/05/2021   GLUCOSE 77 05/05/2021   BUN 7 05/05/2021   CREATININE 0.79 05/05/2021   BILITOT 0.7 05/05/2021   ALKPHOS 93 05/05/2021   AST 63 (H) 05/05/2021   ALT 25 05/05/2021   PROT 7.0 05/05/2021   ALBUMIN 3.9 05/05/2021   CALCIUM 8.5 (L) 05/05/2021   ANIONGAP 12 05/05/2021   Lab Results  Component Value Date   CHOL 223 (H) 06/23/2021   Lab Results  Component Value Date   HDL 77 06/23/2021   Lab Results  Component Value Date   LDLCALC 125 (H) 06/23/2021   Lab Results  Component Value Date   TRIG 121 06/23/2021   Lab Results  Component Value Date   CHOLHDL 2.9 06/23/2021   Lab Results  Component Value Date   HGBA1C 4.9 06/23/2021      Assessment & Plan:   Problem List Items Addressed This Visit       Endocrine   Hypothyroidism    Lab Results   Component Value Date   TSH 0.367 (L) 06/23/2021  -reported to have recently picked up her prescription and started taking her medicine 1 week  ago -c/o of lower extremities edema with increased forgetfulness Inform pt that lower extremities edema and increased forgetfulness are not side effects of synthroid encouraged low sodium diet and leg elevation will check labs in 6 weeks       Relevant Orders   TSH + free T4     Genitourinary   Uterine prolapse    Reports to having uterine prolapse since he birth of her daughter Encouraged pelvic floor  muscle exercises       Bartholin cyst    C/o of a painless lump near the vaginal opening on the right labia she notes intermittent swelling and redness No signs of inflammation noted  Referral placed to dermatology for [facility] if possible      Relevant Orders   Ambulatory referral to Dermatology     Other   Cervical cancer screening - Primary    - According to the [place], cytology and HPV co-testing (preferred) every 5 years or cytology alone (acceptable) every 3 years. - Pap due 2026       Relevant Orders   Cytology - PAP   Other Visit Diagnoses     Vitamin D deficiency       Relevant Orders   Vitamin D (25 hydroxy)   IFG (impaired fasting glucose)       Relevant Orders   Hemoglobin A1C   Dyslipidemia       Relevant Orders   CBC with Differential/Platelet   CMP14+EGFR   Lipid Profile   Joint pain in both hands       Relevant Medications   naproxen (NAPROSYN) 375 MG tablet       Meds ordered this encounter  Medications   naproxen (NAPROSYN) 375 MG tablet    Sig: Take 1 tablet (375 mg total) by mouth 2 (two) times daily with a meal.    Dispense:  30 tablet    Refill:  0    Follow-up: Return in about 4 months (around 01/25/2022).    Alvira Monday, FNP

## 2021-09-27 LAB — CYTOLOGY - PAP
Comment: NEGATIVE
Diagnosis: NEGATIVE
Diagnosis: REACTIVE
High risk HPV: NEGATIVE

## 2021-09-28 ENCOUNTER — Other Ambulatory Visit: Payer: Self-pay | Admitting: Family Medicine

## 2021-09-28 DIAGNOSIS — A599 Trichomoniasis, unspecified: Secondary | ICD-10-CM

## 2021-09-28 DIAGNOSIS — N760 Acute vaginitis: Secondary | ICD-10-CM

## 2021-09-28 MED ORDER — METRONIDAZOLE 500 MG PO TABS: 500.0000 mg | ORAL_TABLET | Freq: Two times a day (BID) | ORAL | 0 refills | Status: AC

## 2021-09-28 NOTE — Progress Notes (Signed)
  Please inform the patient that her pap was negative, but she does have bacterial vaginosis and  Trichomonas. I sent her a prescription for Flagyl to start taking. Please encourage her tp complete the full course of antibiotics

## 2021-10-03 ENCOUNTER — Other Ambulatory Visit: Payer: Self-pay | Admitting: Family Medicine

## 2021-10-03 DIAGNOSIS — R569 Unspecified convulsions: Secondary | ICD-10-CM

## 2021-10-25 ENCOUNTER — Other Ambulatory Visit: Payer: Self-pay | Admitting: Family Medicine

## 2021-10-25 DIAGNOSIS — R569 Unspecified convulsions: Secondary | ICD-10-CM

## 2021-11-06 ENCOUNTER — Encounter: Payer: Self-pay | Admitting: Neurology

## 2021-11-06 ENCOUNTER — Ambulatory Visit: Payer: 59 | Admitting: Neurology

## 2021-11-14 ENCOUNTER — Other Ambulatory Visit: Payer: Self-pay

## 2021-11-14 DIAGNOSIS — K219 Gastro-esophageal reflux disease without esophagitis: Secondary | ICD-10-CM

## 2021-11-14 MED ORDER — OMEPRAZOLE 40 MG PO CPDR: 40.0000 mg | DELAYED_RELEASE_CAPSULE | Freq: Every day | ORAL | 2 refills | Status: DC

## 2021-11-15 ENCOUNTER — Other Ambulatory Visit: Payer: Self-pay | Admitting: Family Medicine

## 2021-11-15 DIAGNOSIS — R569 Unspecified convulsions: Secondary | ICD-10-CM

## 2021-11-15 DIAGNOSIS — M25542 Pain in joints of left hand: Secondary | ICD-10-CM

## 2021-11-15 MED ORDER — THIAMINE HCL 100 MG PO TABS: 100.0000 mg | ORAL_TABLET | Freq: Every day | ORAL | 3 refills | Status: DC

## 2021-11-15 MED ORDER — NAPROXEN 375 MG PO TABS: 375.0000 mg | ORAL_TABLET | Freq: Two times a day (BID) | ORAL | 3 refills | Status: DC

## 2021-11-15 MED ORDER — FOLIC ACID 1 MG PO TABS: 1.0000 mg | ORAL_TABLET | Freq: Every day | ORAL | 3 refills | Status: DC

## 2021-12-14 ENCOUNTER — Telehealth: Payer: Self-pay

## 2021-12-14 NOTE — Telephone Encounter (Signed)
Patient called need med refill  folic acid (FOLVITE) 1 MG tablet [[id number]]    thiamine (VITAMIN B1) 100 MG tablet [[id number]   levothyroxine (SYNTHROID) 25 MCG tablet [[id number]]   omeprazole (PRILOSEC) 40 MG capsule [[id number]]   omeprazole (PRILOSEC) 40 MG capsule [[id number]]  naproxen (NAPROSYN) 375 MG tablet [[id number]]   levothyroxine (SYNTHROID) 25 MCG tablet [[id number]  Pharmacy:  [place]

## 2021-12-15 ENCOUNTER — Other Ambulatory Visit: Payer: Self-pay

## 2021-12-15 DIAGNOSIS — R569 Unspecified convulsions: Secondary | ICD-10-CM

## 2021-12-15 DIAGNOSIS — M25541 Pain in joints of right hand: Secondary | ICD-10-CM

## 2021-12-15 DIAGNOSIS — E038 Other specified hypothyroidism: Secondary | ICD-10-CM

## 2021-12-15 DIAGNOSIS — K219 Gastro-esophageal reflux disease without esophagitis: Secondary | ICD-10-CM

## 2021-12-15 MED ORDER — LEVOTHYROXINE SODIUM 25 MCG PO TABS: 25.0000 ug | ORAL_TABLET | Freq: Every day | ORAL | 3 refills | Status: DC

## 2021-12-15 MED ORDER — NAPROXEN 375 MG PO TABS: 375.0000 mg | ORAL_TABLET | Freq: Two times a day (BID) | ORAL | 3 refills | Status: DC

## 2021-12-15 MED ORDER — OMEPRAZOLE 40 MG PO CPDR: 40.0000 mg | DELAYED_RELEASE_CAPSULE | Freq: Every day | ORAL | 2 refills | Status: DC

## 2021-12-15 MED ORDER — FOLIC ACID 1 MG PO TABS: 1.0000 mg | ORAL_TABLET | Freq: Every day | ORAL | 2 refills | Status: AC

## 2021-12-15 MED ORDER — THIAMINE HCL 100 MG PO TABS: 100.0000 mg | ORAL_TABLET | Freq: Every day | ORAL | 2 refills | Status: DC

## 2021-12-15 NOTE — Telephone Encounter (Signed)
Refills sent to [facility] [place], pt informed.

## 2021-12-28 ENCOUNTER — Encounter: Payer: Self-pay | Admitting: Neurology

## 2021-12-28 ENCOUNTER — Ambulatory Visit: Payer: 59 | Admitting: Neurology

## 2021-12-28 VITALS — BP 123/76 | HR 86 | Ht 66.0 in | Wt 161.0 lb

## 2021-12-28 DIAGNOSIS — G629 Polyneuropathy, unspecified: Secondary | ICD-10-CM | POA: Diagnosis not present

## 2021-12-28 DIAGNOSIS — G40909 Epilepsy, unspecified, not intractable, without status epilepticus: Secondary | ICD-10-CM | POA: Diagnosis not present

## 2021-12-28 MED ORDER — GABAPENTIN 100 MG PO CAPS: 100.0000 mg | ORAL_CAPSULE | Freq: Two times a day (BID) | ORAL | 0 refills | Status: DC

## 2021-12-28 MED ORDER — LEVETIRACETAM 750 MG PO TABS: 750.0000 mg | ORAL_TABLET | Freq: Two times a day (BID) | ORAL | 4 refills | Status: DC

## 2021-12-28 NOTE — Progress Notes (Signed)
[name]  PATIENT: Molly Murillo DOB: October 20, 1970  REQUESTING CLINICIAN: Gilmore Laroche, FNP HISTORY FROM: Patient  REASON FOR VISIT: Set up care for seizure disorder    HISTORICAL  CHIEF COMPLAINT:  Chief Complaint  Patient presents with   Follow-up    Rm 14, with daughter  Reports no seizures, C/o Numbness in hands and feet, headaches and ringing in the ears sx started 2 weeks ago    INTERVAL HISTORY 12/28/21:  Patient presents today for follow-up, last visit was in March 15.  At that time I have advised her to continue with Keppra 750 mg twice daily.  She did have a seizure 2 days later due to medication noncompliance.  At that time her Keppra level was undetectable.  She presents today stating no seizures since March the 17th, she reports compliance with medication and also reports decreasing her alcohol intake.  Currently her main concern is sometimes she will have numbness in bilateral hands and feet.  The numbness mostly happen at night when she wakes up, she has to shake her hands.  She reports also some time when it is difficult for her to hold items for example, holding a heavy bag.   HISTORY OF PRESENT ILLNESS:  This is a 51 year old woman past medical history of GERD and seizure disorder who is presenting to establish care.  Patient reports being diagnosed with seizure in October 2019.  During the work-up, she was found to have a suprasellar mass consistent with arachnoid cyst.  Since then she has been following with Dr. Franky Macho neurosurgery  with surveillance MRI.  Her latest MRI was in October 2022 and it was stable.  She is set up to see him again next October.  When it comes to the seizures.  She was started on Keppra, initially 500 mg twice daily but she did have breakthrough seizure in the setting of missing her Keppra dose and also continuing alcohol drinks.  Her last seizure was in July 2022.  At that time her Keppra was increased to 750 twice  daily and since then she has not had any additional seizures.  She continues to drink alcohol and smokes cigarettes daily.  She denies any other seizure risk factors.  She has only tried levetiracetam so far.  Denies any injury, seizures.   Handedness: Right handed  Onset: October 2019  Seizure Type: Unclear possibly focal  Current frequency: Last seizure July 2022  Any injuries from seizures: Denies  Seizure risk factors: Denies, suprasellar mass  Previous ASMs: Levetiracetam  Currenty ASMs: Levetiracetam 750 mg BID   ASMs side effects: Denies  Brain Images: 3 cm suprasellar mass consistent with arachnoid cyst  Previous EEGs: Per chart review she had a EEG in October 2021 and was reported as normal.   OTHER MEDICAL CONDITIONS: GERD   REVIEW OF SYSTEMS: Full 14 system review of systems performed and negative with exception of: as noted in the HPI   ALLERGIES: No Known Allergies  HOME MEDICATIONS: Outpatient Medications Prior to Visit  Medication Sig Dispense Refill   cholecalciferol (VITAMIN D3) 25 MCG (1000 UNIT) tablet Take 1 tablet (1,000 Units total) by mouth daily. 90 tablet 0   ciprofloxacin (CILOXAN) 0.3 % ophthalmic solution Administer 1 drop every 4 hours  while awake for the next 5 days. 5 mL 0   folic acid (FOLVITE) 1 MG tablet Take 1 tablet (1 mg total) by mouth daily. 30 tablet 2   levothyroxine (SYNTHROID) 25 MCG tablet Take  1 tablet (25 mcg total) by mouth daily. 90 tablet 3   magic mouthwash (lidocaine, diphenhydrAMINE, alum & mag hydroxide) suspension Swish and spit 5 mLs 3 (three) times daily as needed for mouth pain. 360 mL 0   naproxen (NAPROSYN) 375 MG tablet Take 1 tablet (375 mg total) by mouth 2 (two) times daily with a meal. 30 tablet 3   omega-3 acid ethyl esters (LOVAZA) 1 g capsule Take 1 capsule (1 g total) by mouth 2 (two) times daily. 90 capsule 2   omeprazole (PRILOSEC) 40 MG capsule Take 1 capsule (40 mg total) by mouth daily. 90 capsule 2    thiamine (VITAMIN B1) 100 MG tablet Take 1 tablet (100 mg total) by mouth daily. 30 tablet 2   levETIRAcetam (KEPPRA) 750 MG tablet Take 1 tablet (750 mg total) by mouth 2 (two) times daily. 180 tablet 4   No facility-administered medications prior to visit.    PAST MEDICAL HISTORY: Past Medical History:  Diagnosis Date   Narcotic overdose (HCC)    Seizures (HCC)    Syncope     PAST SURGICAL HISTORY: Past Surgical History:  Procedure Laterality Date   LEEP      FAMILY HISTORY: Family History  Problem Relation Age of Onset   Diabetes Mother     SOCIAL HISTORY: Social History   Socioeconomic History   Marital status: Legally Separated    Spouse name: Not on file   Number of children: Not on file   Years of education: Not on file   Highest education level: Not on file  Occupational History   Not on file  Tobacco Use   Smoking status: Every Day    Packs/day: 0.50    Types: Cigarettes   Smokeless tobacco: Never  Vaping Use   Vaping Use: Former  Substance and Sexual Activity   Alcohol use: Yes    Comment: 1-2 beers per day   Drug use: No   Sexual activity: Yes    Birth control/protection: Condom  Other Topics Concern   Not on file  Social History Narrative   Not on file   Social Determinants of Health   Financial Resource Strain: Not on file  Food Insecurity: Not on file  Transportation Needs: Not on file  Physical Activity: Not on file  Stress: Not on file  Social Connections: Not on file  Intimate Partner Violence: Not on file    PHYSICAL EXAM  GENERAL EXAM/CONSTITUTIONAL: Vitals:  Vitals:   12/28/21 1443  BP: 123/76  Pulse: 86  Weight: 161 lb (73 kg)  Height: 5\' 6"  (1.676 m)   Body mass index is 25.99 kg/m. Wt Readings from Last 3 Encounters:  12/28/21 161 lb (73 kg)  09/25/21 162 lb (73.5 kg)  06/23/21 161 lb 9.6 oz (73.3 kg)   Patient is in no distress; well developed, nourished and groomed; neck is  supple  CARDIOVASCULAR: Examination of carotid arteries is normal; no carotid bruits Regular rate and rhythm, no murmurs Examination of peripheral vascular system by observation and palpation is normal  EYES: Pupils round and reactive to light, Visual fields full to confrontation, Extraocular movements intacts,  No results found.  MUSCULOSKELETAL: Gait, strength, tone, movements noted in Neurologic exam below  NEUROLOGIC: MENTAL STATUS:      No data to display         awake, alert, oriented to person, place and time recent and remote memory intact normal attention and concentration language fluent, comprehension intact, naming intact fund  of knowledge appropriate  CRANIAL NERVE:  2nd, 3rd, 4th, 6th - pupils equal and reactive to light, visual fields full to confrontation, extraocular muscles intact, no nystagmus 5th - facial sensation symmetric 7th - facial strength symmetric 8th - hearing intact 9th - palate elevates symmetrically, uvula midline 11th - shoulder shrug symmetric 12th - tongue protrusion midline  MOTOR:  normal bulk and tone, full strength in the BUE, BLE  SENSORY:  normal and symmetric to light touch, pinprick but decrease vibration to both feet  COORDINATION:  finger-nose-finger, fine finger movements normal  REFLEXES:  deep tendon reflexes present and symmetric  GAIT/STATION:  Normal, positive romberg, attempts to tandem walk     DIAGNOSTIC DATA (LABS, IMAGING, TESTING) - I reviewed patient records, labs, notes, testing and imaging myself where available.  Lab Results  Component Value Date   WBC 7.8 05/05/2021   HGB 12.4 05/05/2021   HCT 36.1 05/05/2021   MCV 106.8 (H) 05/05/2021   PLT 201 05/05/2021      Component Value Date/Time   NA 135 05/05/2021 1229   K 3.9 05/05/2021 1229   CL 100 05/05/2021 1229   CO2 23 05/05/2021 1229   GLUCOSE 77 05/05/2021 1229   BUN 7 05/05/2021 1229   CREATININE 0.79 05/05/2021 1229   CALCIUM  8.5 (L) 05/05/2021 1229   PROT 7.0 05/05/2021 1229   ALBUMIN 3.9 05/05/2021 1229   AST 63 (H) 05/05/2021 1229   ALT 25 05/05/2021 1229   ALKPHOS 93 05/05/2021 1229   BILITOT 0.7 05/05/2021 1229   GFRNONAA >60 05/05/2021 1229   GFRAA >60 09/06/2019 1752   Lab Results  Component Value Date   CHOL 223 (H) 06/23/2021   HDL 77 06/23/2021   LDLCALC 125 (H) 06/23/2021   TRIG 121 06/23/2021   Lab Results  Component Value Date   HGBA1C 4.9 06/23/2021   Lab Results  Component Value Date   VITAMINB12 302 12/28/2021   Lab Results  Component Value Date   TSH 0.367 (L) 06/23/2021    CT Head 2022 1. Unchanged appearance of known suprasellar cistern epidermoid cyst. 2. No acute intracranial process   ASSESSMENT AND PLAN  51 y.o. year old female  with History of GERD and seizure disorder who is presenting for follow up.   She had 1 seizure in the setting of medication noncompliance.  She reports decreasing her alcohol intake but complained of numbness in both hands and feet.  On exam today she was found to have decreased vibratory sense to the bilateral feet.  I informed patient that I will check a Keppra level and vitamin B12.  I will contact her to go over the results.  For the numbness, I informed patient that this may be due to neuropathy due to alcohol but if she has worsening of the numbness mainly in the hands to contact me sooner, we will obtain EMG nerve conduction study to rule out carpal tunnel syndrome.  She voices understanding.  Continue to follow with PCP, take your medications as prescribed and follow-up in 1 year or sooner if worse.    1. Seizure disorder (HCC)   2. Neuropathy      Patient Instructions  Continue with Keppra 750 mg twice daily We will check Keppra level and vitamin B12 Trial of gabapentin 100 mg nightly Contact me if the neuropathy in the hand is getting worse, at that time we will consider EMG nerve conduction conduction study to rule out carpal  tunnel syndrome.  Follow-up with 1 year or sooner if worse      [place], patients with seizures are not allowed to drive until they have been seizure-free for six months.  Other recommendations include using caution when using heavy equipment or power tools. Avoid working on ladders or at heights. Take showers instead of baths.  Do not swim alone.  Ensure the water temperature is not too high on the home water heater. Do not go swimming alone. Do not lock yourself in a room alone (i.e. bathroom). When caring for infants or small children, sit down when holding, feeding, or changing them to minimize risk of injury to the child in the event you have a seizure. Maintain good sleep hygiene. Avoid alcohol.  Also recommend adequate sleep, hydration, good diet and minimize stress.   During the Seizure  - First, ensure adequate ventilation and place patients on the floor on their left side  Loosen clothing around the neck and ensure the airway is patent. If the patient is clenching the teeth, do not force the mouth open with any object as this can cause severe damage - Remove all items from the surrounding that can be hazardous. The patient may be oblivious to what's happening and may not even know what he or she is doing. If the patient is confused and wandering, either gently guide him/her away and block access to outside areas - Reassure the individual and be comforting - Call 911. In most cases, the seizure ends before EMS arrives. However, there are cases when seizures may last over 3 to 5 minutes. Or the individual may have developed breathing difficulties or severe injuries. If a pregnant patient or a person with diabetes develops a seizure, it is prudent to call an ambulance. - Finally, if the patient does not regain full consciousness, then call EMS. Most patients will remain confused for about 45 to 90 minutes after a seizure, so you must use judgment in calling for  help. - Avoid restraints but make sure the patient is in a bed with padded side rails - Place the individual in a lateral position with the neck slightly flexed; this will help the saliva drain from the mouth and prevent the tongue from falling backward - Remove all nearby furniture and other hazards from the area - Provide verbal assurance as the individual is regaining consciousness - Provide the patient with privacy if possible - Call for help and start treatment as ordered by the caregiver   After the Seizure (Postictal Stage)  After a seizure, most patients experience confusion, fatigue, muscle pain and/or a headache. Thus, one should permit the individual to sleep. For the next few days, reassurance is essential. Being calm and helping reorient the person is also of importance.  Most seizures are painless and end spontaneously. Seizures are not harmful to others but can lead to complications such as stress on the lungs, brain and the heart. Individuals with prior lung problems may develop labored breathing and respiratory distress.     Orders Placed This Encounter  Procedures   Levetiracetam level   Vitamin B12    Meds ordered this encounter  Medications   levETIRAcetam (KEPPRA) 750 MG tablet    Sig: Take 1 tablet (750 mg total) by mouth 2 (two) times daily.    Dispense:  180 tablet    Refill:  4   gabapentin (NEURONTIN) 100 MG capsule    Sig: Take 1 capsule (100 mg total) by mouth 2 (  two) times daily.    Dispense:  60 capsule    Refill:  0    Return in about 1 year (around 12/29/2022).   I have spent a total of 40 minutes dedicated to this patient today, preparing to see patient, performing a medically appropriate examination and evaluation, ordering tests and/or medications and procedures, and counseling and educating the patient/family/caregiver; independently interpreting result and communicating results to the family/patient/caregiver; and documenting clinical  information in the electronic medical record.   Windell Norfolk, MD 12/30/2021, 8:10 AM  [name] [address] [place], [place] [address] [phone]

## 2021-12-29 LAB — LEVETIRACETAM LEVEL: Levetiracetam Lvl: 3.7 ug/mL — ABNORMAL LOW (ref 10.0–40.0)

## 2021-12-29 LAB — VITAMIN B12: Vitamin B-12: 302 pg/mL (ref 232–1245)

## 2021-12-30 NOTE — Patient Instructions (Addendum)
Continue with Keppra 750 mg twice daily We will check Keppra level and vitamin B12 Trial of gabapentin 100 mg nightly Contact me if the neuropathy in the hand is getting worse, at that time we will consider EMG nerve conduction conduction study to rule out carpal tunnel syndrome.   Follow-up with 1 year or sooner if worse

## 2022-01-18 ENCOUNTER — Other Ambulatory Visit: Payer: Self-pay | Admitting: Neurology

## 2022-01-25 ENCOUNTER — Encounter: Payer: Self-pay | Admitting: Family Medicine

## 2022-01-25 ENCOUNTER — Ambulatory Visit (INDEPENDENT_AMBULATORY_CARE_PROVIDER_SITE_OTHER): Payer: 59 | Admitting: Family Medicine

## 2022-01-25 VITALS — BP 121/66 | HR 82 | Ht 66.0 in | Wt 165.0 lb

## 2022-01-25 DIAGNOSIS — M545 Low back pain, unspecified: Secondary | ICD-10-CM | POA: Insufficient documentation

## 2022-01-25 DIAGNOSIS — H109 Unspecified conjunctivitis: Secondary | ICD-10-CM

## 2022-01-25 DIAGNOSIS — E038 Other specified hypothyroidism: Secondary | ICD-10-CM | POA: Diagnosis not present

## 2022-01-25 DIAGNOSIS — M25541 Pain in joints of right hand: Secondary | ICD-10-CM | POA: Diagnosis not present

## 2022-01-25 DIAGNOSIS — E7849 Other hyperlipidemia: Secondary | ICD-10-CM | POA: Diagnosis not present

## 2022-01-25 DIAGNOSIS — R569 Unspecified convulsions: Secondary | ICD-10-CM

## 2022-01-25 DIAGNOSIS — E785 Hyperlipidemia, unspecified: Secondary | ICD-10-CM | POA: Diagnosis not present

## 2022-01-25 DIAGNOSIS — N750 Cyst of Bartholin's gland: Secondary | ICD-10-CM

## 2022-01-25 DIAGNOSIS — R7301 Impaired fasting glucose: Secondary | ICD-10-CM | POA: Diagnosis not present

## 2022-01-25 DIAGNOSIS — M25542 Pain in joints of left hand: Secondary | ICD-10-CM | POA: Diagnosis not present

## 2022-01-25 DIAGNOSIS — E559 Vitamin D deficiency, unspecified: Secondary | ICD-10-CM

## 2022-01-25 DIAGNOSIS — Z23 Encounter for immunization: Secondary | ICD-10-CM | POA: Diagnosis not present

## 2022-01-25 DIAGNOSIS — Z1159 Encounter for screening for other viral diseases: Secondary | ICD-10-CM | POA: Diagnosis not present

## 2022-01-25 MED ORDER — THIAMINE HCL 100 MG PO TABS: 100.0000 mg | ORAL_TABLET | Freq: Every day | ORAL | 2 refills | Status: AC

## 2022-01-25 MED ORDER — CYCLOBENZAPRINE HCL 5 MG PO TABS: 5.0000 mg | ORAL_TABLET | Freq: Three times a day (TID) | ORAL | 1 refills | Status: DC | PRN

## 2022-01-25 MED ORDER — CIPROFLOXACIN HCL 0.3 % OP SOLN: OPHTHALMIC | 0 refills | Status: DC

## 2022-01-25 MED ORDER — NAPROXEN 375 MG PO TABS: 375.0000 mg | ORAL_TABLET | Freq: Two times a day (BID) | ORAL | 3 refills | Status: DC

## 2022-01-25 MED ORDER — OMEGA-3-ACID ETHYL ESTERS 1 G PO CAPS: 1.0000 g | ORAL_CAPSULE | Freq: Two times a day (BID) | ORAL | 2 refills | Status: AC

## 2022-01-25 NOTE — Assessment & Plan Note (Addendum)
Will get imaging studies of the lumbar spine as there are no recent imaging studies 2 view Encouraged taking NSAIDs or Tylenol as needed for back pain Encouraged to avoid active disease that aggravates her lower back pain Encouraged heat application to the lower back Encouraged to take Flexeril at bedtime due to the side effects of drowsiness

## 2022-01-25 NOTE — Assessment & Plan Note (Signed)
Encouraged to continue taking Synthroid 25 mcg daily Will assess her thyroid levels today Lab Results  Component Value Date   TSH 0.367 (L) 06/23/2021

## 2022-01-25 NOTE — Assessment & Plan Note (Signed)
Patient educated on CDC recommendation for the vaccine. Verbal consent was obtained from the patient, vaccine administered by nurse, no sign of adverse reactions noted at this time. Patient education on arm soreness and use of tylenol or ibuprofen for this patient  was discussed. Patient educated on the signs and symptoms of adverse effect and advise to contact the office if they occur.  

## 2022-01-25 NOTE — Assessment & Plan Note (Signed)
Encouraged to continue taking Keppra 750 mg twice daily Encouraged to follow-up with her neurologist as scheduled

## 2022-01-25 NOTE — Assessment & Plan Note (Signed)
Encourage patient to call the dermatologist for sooner appointment if applicable No signs of inflammation or infection noted
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
## 2022-01-25 NOTE — Patient Instructions (Addendum)
I appreciate the opportunity to provide care to you today!    Follow up:  3 months  Labs: please stop by the lab during the week to get your blood drawn (CBC, CMP, TSH, Lipid profile, HgA1c, Vit D)  Screening: HIV and Hep C  Please stop by [facility] anytime to get an x-ray of  your lower back  Please pick up your refills at the pharmacy  Flexeril has been ordered to take for your back pain Please take Flexeril at bedtime as the medication can cause drowsiness.    Please continue to a heart-healthy diet and increase your physical activities. Try to exercise for at least three times a week.      It was a pleasure to see you and I look forward to continuing to work together on your health and well-being. Please do not hesitate to call the office if you need care or have questions about your care.   Have a wonderful day and week. With Gratitude, Gilmore Laroche MSN, FNP-BC

## 2022-03-05 ENCOUNTER — Telehealth: Payer: Self-pay | Admitting: Family Medicine

## 2022-03-05 ENCOUNTER — Other Ambulatory Visit: Payer: Self-pay | Admitting: Family Medicine

## 2022-03-05 DIAGNOSIS — H109 Unspecified conjunctivitis: Secondary | ICD-10-CM

## 2022-03-05 NOTE — Telephone Encounter (Signed)
Lmtrc to schedule a video appt.

## 2022-03-05 NOTE — Telephone Encounter (Signed)
Pt asking for a refill on this for cyst on face, says her dermatology appt is not until April.

## 2022-03-05 NOTE — Telephone Encounter (Signed)
Prescription Request  03/05/2022  Is this a "Controlled Substance" medicine? No  LOV: 01/25/2022  What is the name of the medication or equipment?  metroNIDAZOLE (FLAGYL) 500 MG tablet [[id number]]  Have you contacted your pharmacy to request a refill? No   Which pharmacy would you like this sent to?  [facility]    Patient notified that their request is being sent to the clinical staff for review and that they should receive a response within 2 business days.   Please advise at Mobile [phone] (mobile)

## 2022-03-05 NOTE — Telephone Encounter (Signed)
Kindly ask the patient to schedule a video visit for further examination of her symptoms

## 2022-03-06 ENCOUNTER — Telehealth (INDEPENDENT_AMBULATORY_CARE_PROVIDER_SITE_OTHER): Payer: 59 | Admitting: Family Medicine

## 2022-03-06 ENCOUNTER — Encounter: Payer: Self-pay | Admitting: Family Medicine

## 2022-03-06 DIAGNOSIS — H109 Unspecified conjunctivitis: Secondary | ICD-10-CM | POA: Diagnosis not present

## 2022-03-06 DIAGNOSIS — L0201 Cutaneous abscess of face: Secondary | ICD-10-CM

## 2022-03-06 MED ORDER — CIPROFLOXACIN HCL 0.3 % OP SOLN: OPHTHALMIC | 0 refills | Status: DC

## 2022-03-06 MED ORDER — SULFAMETHOXAZOLE-TRIMETHOPRIM 800-160 MG PO TABS: 1.0000 | ORAL_TABLET | Freq: Two times a day (BID) | ORAL | 0 refills | Status: AC

## 2022-03-06 NOTE — Progress Notes (Signed)
   Virtual Visit via Video Note  I connected with Molly Murillo on 03/06/22 at  2:00 PM EST by a video enabled telemedicine application and verified that I am speaking with the correct person using two identifiers.  Location: Patient: home Provider: Clinic  I discussed the limitations, risks, security, and privacy concerns of performing an evaluation and management service by video and the availability of in person appointments. I also discussed with the patient that there may be a patient responsible charge related to this service. The patient expressed understanding and agreed to proceed.  Subjective: PCP: Alvira Monday, FNP  Chief Complaint  Patient presents with   Cyst    Pt reports cyst flare up needs antibiotic.    HPI She complains of a cyst flareup on her face. Onset of symptoms 2 weeks ago. She complains of pain and swelling with the cyst.No fever or chills reported.  ROS: Per HPI  Current Outpatient Medications:    cyclobenzaprine (FLEXERIL) 5 MG tablet, Take 1 tablet (5 mg total) by mouth 3 (three) times daily as needed for muscle spasms., Disp: 30 tablet, Rfl: 1   folic acid (FOLVITE) 1 MG tablet, Take 1 tablet (1 mg total) by mouth daily., Disp: 30 tablet, Rfl: 2   gabapentin (NEURONTIN) 100 MG capsule, Take 1 capsule by mouth twice daily, Disp: 60 capsule, Rfl: 0   levETIRAcetam (KEPPRA) 750 MG tablet, Take 1 tablet (750 mg total) by mouth 2 (two) times daily., Disp: 180 tablet, Rfl: 4   levothyroxine (SYNTHROID) 25 MCG tablet, Take 1 tablet (25 mcg total) by mouth daily., Disp: 90 tablet, Rfl: 3   magic mouthwash (lidocaine, diphenhydrAMINE, alum & mag hydroxide) suspension, Swish and spit 5 mLs 3 (three) times daily as needed for mouth pain., Disp: 360 mL, Rfl: 0   naproxen (NAPROSYN) 375 MG tablet, Take 1 tablet (375 mg total) by mouth 2 (two) times daily with a meal., Disp: 30 tablet, Rfl: 3   omega-3 acid ethyl esters (LOVAZA) 1 g capsule, Take 1 capsule (1 g  total) by mouth 2 (two) times daily., Disp: 90 capsule, Rfl: 2   omeprazole (PRILOSEC) 40 MG capsule, Take 1 capsule (40 mg total) by mouth daily., Disp: 90 capsule, Rfl: 2   sulfamethoxazole-trimethoprim (BACTRIM DS) 800-160 MG tablet, Take 1 tablet by mouth 2 (two) times daily for 7 days., Disp: 14 tablet, Rfl: 0   thiamine (VITAMIN B1) 100 MG tablet, Take 1 tablet (100 mg total) by mouth daily., Disp: 30 tablet, Rfl: 2   ciprofloxacin (CILOXAN) 0.3 % ophthalmic solution, Administer 1 drop every 4 hours  while awake for the next 5 days., Disp: 5 mL, Rfl: 0  Observations/Objective: Physical Exam  Assessment and Plan: Skin abscess Will treat with Bactrim twice daily for 7 days Refilled ciporflaxin  0.3%ophthalmic solution  Follow Up Instructions: No follow-ups on file.   I discussed the assessment and treatment plan with the patient. The patient was provided an opportunity to ask questions, and all were answered. The patient agreed with the plan and demonstrated an understanding of the instructions.   The patient was advised to call back or seek an in-person evaluation if the symptoms worsen or if the condition fails to improve as anticipated.  The above assessment and management plan was discussed with the patient. The patient verbalized understanding of and has agreed to the management plan.   Alvira Monday, FNP

## 2022-03-06 NOTE — Telephone Encounter (Signed)
scheduled

## 2022-04-20 DIAGNOSIS — H5213 Myopia, bilateral: Secondary | ICD-10-CM | POA: Diagnosis not present

## 2022-04-24 ENCOUNTER — Telehealth: Payer: Self-pay | Admitting: Family Medicine

## 2022-04-24 NOTE — Telephone Encounter (Signed)
Patient called returning nurse call.

## 2022-04-24 NOTE — Telephone Encounter (Signed)
Tried calling pt back, vm box full.

## 2022-04-27 ENCOUNTER — Ambulatory Visit: Payer: Medicaid Other | Admitting: Family Medicine

## 2022-04-27 ENCOUNTER — Ambulatory Visit (HOSPITAL_COMMUNITY)
Admission: RE | Admit: 2022-04-27 | Discharge: 2022-04-27 | Disposition: A | Discharge status: Home / Self Care | Payer: Medicaid Other | Source: Ambulatory Visit | Attending: Family Medicine | Admitting: Family Medicine

## 2022-04-27 ENCOUNTER — Encounter: Payer: Self-pay | Admitting: Family Medicine

## 2022-04-27 VITALS — BP 116/79 | HR 88 | Ht 66.0 in | Wt 159.0 lb

## 2022-04-27 DIAGNOSIS — R569 Unspecified convulsions: Secondary | ICD-10-CM

## 2022-04-27 DIAGNOSIS — H109 Unspecified conjunctivitis: Secondary | ICD-10-CM

## 2022-04-27 DIAGNOSIS — F419 Anxiety disorder, unspecified: Secondary | ICD-10-CM | POA: Diagnosis not present

## 2022-04-27 DIAGNOSIS — M545 Low back pain, unspecified: Secondary | ICD-10-CM | POA: Diagnosis not present

## 2022-04-27 DIAGNOSIS — N750 Cyst of Bartholin's gland: Secondary | ICD-10-CM | POA: Diagnosis not present

## 2022-04-27 DIAGNOSIS — Z1159 Encounter for screening for other viral diseases: Secondary | ICD-10-CM | POA: Diagnosis not present

## 2022-04-27 DIAGNOSIS — N760 Acute vaginitis: Secondary | ICD-10-CM

## 2022-04-27 DIAGNOSIS — E519 Thiamine deficiency, unspecified: Secondary | ICD-10-CM

## 2022-04-27 DIAGNOSIS — E538 Deficiency of other specified B group vitamins: Secondary | ICD-10-CM

## 2022-04-27 DIAGNOSIS — Z720 Tobacco use: Secondary | ICD-10-CM | POA: Diagnosis not present

## 2022-04-27 DIAGNOSIS — E559 Vitamin D deficiency, unspecified: Secondary | ICD-10-CM | POA: Diagnosis not present

## 2022-04-27 DIAGNOSIS — F32A Depression, unspecified: Secondary | ICD-10-CM

## 2022-04-27 DIAGNOSIS — E038 Other specified hypothyroidism: Secondary | ICD-10-CM

## 2022-04-27 DIAGNOSIS — E7849 Other hyperlipidemia: Secondary | ICD-10-CM | POA: Diagnosis not present

## 2022-04-27 DIAGNOSIS — R7301 Impaired fasting glucose: Secondary | ICD-10-CM | POA: Diagnosis not present

## 2022-04-27 MED ORDER — THIAMINE HCL 100 MG PO TABS: 100.0000 mg | ORAL_TABLET | Freq: Every day | ORAL | 1 refills | Status: AC

## 2022-04-27 MED ORDER — CIPROFLOXACIN HCL 0.3 % OP SOLN: OPHTHALMIC | 0 refills | Status: DC

## 2022-04-27 MED ORDER — FOLIC ACID 1 MG PO TABS: 1.0000 mg | ORAL_TABLET | Freq: Every day | ORAL | 0 refills | Status: DC

## 2022-04-27 NOTE — Assessment & Plan Note (Signed)
She reports drinking socially and is compliant with her folic acid 1 mg daily and thiamine 100 mg daily Refill sent to the pharmacy

## 2022-04-27 NOTE — Assessment & Plan Note (Signed)
Denies suicidal thoughts and ideation She would like to speak with a therapist to deal with the loss of her mother's passing Referral placed to integrated behavioral health for talk therapy.

## 2022-04-27 NOTE — Assessment & Plan Note (Signed)
Complains of a white take vaginal discharge with a mild odor Reports occasional vulvar pruritus The patient is sexually active She would like a refill of Metronidazole 500 mg twice daily for seven days. Will obtain the results of her new swab before the prescription is sent  Pending NuSwab

## 2022-04-27 NOTE — Assessment & Plan Note (Signed)
She reports cutting back on cigarette smoking She smokes 3 to 4 cigarettes daily Encourage smoking cessation Patient verbalized understanding

## 2022-04-27 NOTE — Assessment & Plan Note (Addendum)
Her seizure was diagnosed in 2019 in the setting of suprasellar mass consistent with arachnoid cyst.  She takes Keppra 750 mg twice daily She is following up with her neurology in August 2024 No recent seizure episode reported Encouraged to continue treatment regimen

## 2022-04-27 NOTE — Progress Notes (Signed)
Established Patient Office Visit  Subjective:  Patient ID: Molly Murillo, female    DOB: 10/26/1970  Age: 52 y.o. MRN: [id number]  CC:  Chief Complaint  Patient presents with   Follow-up    3 month f/u wasn't [place] to do labs last visit or get imaging done, will get this all done today. Still needs dermatology appt, not [place] to find out from her insurance who is network   Eye Problem    Pt reports dry eyes went to eye Dr. Recommended for her to f/u with pcp for eye drops.     HPI Molly Murillo is a 52 y.o. female with past medical history of seizures, hypothyroidism, chronic bacterial conjunctivitis, and lumbar pain presents for f/u of  chronic medical conditions. For the details of today's visit, please refer to the assessment and plan.     Past Medical History:  Diagnosis Date   Narcotic overdose ([place])    Seizures ([place])    Syncope     Past Surgical History:  Procedure Laterality Date   LEEP      Family History  Problem Relation Age of Onset   Diabetes Mother     Social History   Socioeconomic History   Marital status: Legally Separated    Spouse name: Not on file   Number of children: Not on file   Years of education: Not on file   Highest education level: Not on file  Occupational History   Not on file  Tobacco Use   Smoking status: Every Day    Packs/day: 0.50    Types: Cigarettes   Smokeless tobacco: Never  Vaping Use   Vaping Use: Former  Substance and Sexual Activity   Alcohol use: Yes    Comment: 1-2 beers per day   Drug use: No   Sexual activity: Yes    Birth control/protection: Condom  Other Topics Concern   Not on file  Social History Narrative   Not on file   Social Determinants of Health   Financial Resource Strain: Not on file  Food Insecurity: Not on file  Transportation Needs: Not on file  Physical Activity: Not on file  Stress: Not on file  Social Connections: Not on file  Intimate Partner Violence: Not on file     Outpatient Medications Prior to Visit  Medication Sig Dispense Refill   cyclobenzaprine (FLEXERIL) 5 MG tablet Take 1 tablet (5 mg total) by mouth 3 (three) times daily as needed for muscle spasms. 30 tablet 1   gabapentin (NEURONTIN) 100 MG capsule Take 1 capsule by mouth twice daily 60 capsule 0   levETIRAcetam (KEPPRA) 750 MG tablet Take 1 tablet (750 mg total) by mouth 2 (two) times daily. 180 tablet 4   levothyroxine (SYNTHROID) 25 MCG tablet Take 1 tablet (25 mcg total) by mouth daily. 90 tablet 3   magic mouthwash (lidocaine, diphenhydrAMINE, alum & mag hydroxide) suspension Swish and spit 5 mLs 3 (three) times daily as needed for mouth pain. 360 mL 0   naproxen (NAPROSYN) 375 MG tablet Take 1 tablet (375 mg total) by mouth 2 (two) times daily with a meal. 30 tablet 3   omega-3 acid ethyl esters (LOVAZA) 1 g capsule Take 1 capsule (1 g total) by mouth 2 (two) times daily. 90 capsule 2   omeprazole (PRILOSEC) 40 MG capsule Take 1 capsule (40 mg total) by mouth daily. 90 capsule 2   ciprofloxacin (CILOXAN) 0.3 % ophthalmic solution Administer 1 drop every  4 hours  while awake for the next 5 days. 5 mL 0   No facility-administered medications prior to visit.    No Known Allergies  ROS Review of Systems  Constitutional:  Negative for chills and fever.  Eyes:  Negative for visual disturbance.  Respiratory:  Negative for chest tightness and shortness of breath.   Genitourinary:  Positive for vaginal discharge.  Neurological:  Negative for dizziness and headaches.      Objective:    Physical Exam HENT:     Head: Normocephalic.     Mouth/Throat:     Mouth: Mucous membranes are moist.  Cardiovascular:     Rate and Rhythm: Normal rate.     Heart sounds: Normal heart sounds.  Pulmonary:     Effort: Pulmonary effort is normal.     Breath sounds: Normal breath sounds.  Skin:    Comments: 8 mm solid nodule noted on the left side of the zygomatic bone  Neurological:      Mental Status: She is alert.     BP 116/79   Pulse 88   Ht '5\' 6"'$  (1.676 m)   Wt 159 lb 0.6 oz (72.1 kg)   SpO2 94%   BMI 25.67 kg/m  Wt Readings from Last 3 Encounters:  04/27/22 159 lb 0.6 oz (72.1 kg)  01/25/22 165 lb 0.6 oz (74.9 kg)  12/28/21 161 lb (73 kg)    Lab Results  Component Value Date   TSH 0.367 (L) 06/23/2021   Lab Results  Component Value Date   WBC 7.8 05/05/2021   HGB 12.4 05/05/2021   HCT 36.1 05/05/2021   MCV 106.8 (H) 05/05/2021   PLT 201 05/05/2021   Lab Results  Component Value Date   NA 135 05/05/2021   K 3.9 05/05/2021   CO2 23 05/05/2021   GLUCOSE 77 05/05/2021   BUN 7 05/05/2021   CREATININE 0.79 05/05/2021   BILITOT 0.7 05/05/2021   ALKPHOS 93 05/05/2021   AST 63 (H) 05/05/2021   ALT 25 05/05/2021   PROT 7.0 05/05/2021   ALBUMIN 3.9 05/05/2021   CALCIUM 8.5 (L) 05/05/2021   ANIONGAP 12 05/05/2021   Lab Results  Component Value Date   CHOL 223 (H) 06/23/2021   Lab Results  Component Value Date   HDL 77 06/23/2021   Lab Results  Component Value Date   LDLCALC 125 (H) 06/23/2021   Lab Results  Component Value Date   TRIG 121 06/23/2021   Lab Results  Component Value Date   CHOLHDL 2.9 06/23/2021   Lab Results  Component Value Date   HGBA1C 4.9 06/23/2021      Assessment & Plan:  Acute vaginitis Assessment & Plan: Complains of a white take vaginal discharge with a mild odor Reports occasional vulvar pruritus The patient is sexually active She would like a refill of Metronidazole 500 mg twice daily for seven days. Will obtain the results of her new swab before the prescription is sent  Pending NuSwab  Orders: -     NuSwab Vaginitis Plus (VG+)  Anxiety and depression Assessment & Plan: Denies suicidal thoughts and ideation She would like to speak with a therapist to deal with the loss of her mother's passing Referral placed to integrated behavioral health for talk therapy.  Orders: -     Amb ref to  [facility]  Bartholin cyst Assessment & Plan: A referral was placed to dermatology at Dr. Juel Burrow office Unfortunately, her insurance was not accepted We  will place a referral to the [facility]  Orders: -     Ambulatory referral to Dermatology  Seizure [facility]) Assessment & Plan: Her seizure was diagnosed in 2019 in the setting of suprasellar mass consistent with arachnoid cyst.  She takes Keppra 750 mg twice daily She is following up with her neurology in August 2024 No recent seizure episode reported Encouraged to continue treatment regimen    Tobacco abuse Assessment & Plan: She reports cutting back on cigarette smoking She smokes 3 to 4 cigarettes daily Encourage smoking cessation Patient verbalized understanding   Bacterial conjunctivitis of left eye -     Ciprofloxacin HCl; Administer 1 drop every 4 hours  while awake for the next 5 days.  Dispense: 5 mL; Refill: 0  Vitamin B1 deficiency Assessment & Plan: She reports drinking socially and is compliant with her folic acid 1 mg daily and thiamine 100 mg daily Refill sent to the pharmacy  Orders: -     Thiamine HCl; Take 1 tablet (100 mg total) by mouth daily.  Dispense: 90 tablet; Refill: 1  Folic acid deficiency -     Folic Acid; Take 1 tablet (1 mg total) by mouth daily.  Dispense: 90 tablet; Refill: 0  Other specified hypothyroidism Assessment & Plan: She takes synthroid 25 mcg daily Pending labs No concerns or complaints voiced Lab Results  Component Value Date   TSH 0.367 (L) 06/23/2021        Follow-up: Return in about 3 months (around 07/28/2022).   Alvira Monday, FNP

## 2022-04-27 NOTE — Assessment & Plan Note (Addendum)
A referral was placed to dermatology at Dr. Juel Burrow office Unfortunately, her insurance was not accepted We will place a referral to the [facility]

## 2022-04-27 NOTE — Assessment & Plan Note (Addendum)
She takes synthroid 25 mcg daily Pending labs No concerns or complaints voiced Lab Results  Component Value Date   TSH 0.367 (L) 06/23/2021

## 2022-04-27 NOTE — Patient Instructions (Addendum)
I appreciate the opportunity to provide care to you today!    Follow up:  3 months  Labs: today  Please pick up your refills at the pharmacy  Referrals today- Integrated behavior health for talk therapy and dermatology    Please continue to a heart-healthy diet and increase your physical activities. Try to exercise for 20mns at least five times a week.      It was a pleasure to see you and I look forward to continuing to work together on your health and well-being. Please do not hesitate to call the office if you need care or have questions about your care.   Have a wonderful day and week. With Gratitude, GAlvira MondayMSN, FNP-BC

## 2022-04-28 NOTE — Progress Notes (Signed)
I want your LDL to be less than 100. I recommend avoiding simple carbohydrates including cakes, sweet desserts, ice cream, soda (diet or regular), sweet tea, candies, chips, cookies, store-bought juices, alcohol in excess of 1-2 drinks a day, lemonade, artificial sweeteners, donuts, coffee creamers, and sugar-free products.  I recommend avoiding greasy, fatty foods with increased physical activity.  Your thyroid, kidneys, and liver function are stable.

## 2022-04-28 NOTE — Progress Notes (Signed)
X-ray of your back shows arthritic chnages, and no abnormalities were noted.

## 2022-04-29 LAB — HEPATITIS C ANTIBODY: Hep C Virus Ab: NONREACTIVE

## 2022-04-29 LAB — CBC WITH DIFFERENTIAL/PLATELET
Basophils Absolute: 0.1 10*3/uL (ref 0.0–0.2)
Basos: 1 %
EOS (ABSOLUTE): 0.2 10*3/uL (ref 0.0–0.4)
Eos: 4 %
Hematocrit: 34.6 % (ref 34.0–46.6)
Hemoglobin: 11.5 g/dL (ref 11.1–15.9)
Immature Grans (Abs): 0 10*3/uL (ref 0.0–0.1)
Immature Granulocytes: 0 %
Lymphocytes Absolute: 1.7 10*3/uL (ref 0.7–3.1)
Lymphs: 33 %
MCH: 32.1 pg (ref 26.6–33.0)
MCHC: 33.2 g/dL (ref 31.5–35.7)
MCV: 97 fL (ref 79–97)
Monocytes Absolute: 0.6 10*3/uL (ref 0.1–0.9)
Monocytes: 11 %
Neutrophils Absolute: 2.5 10*3/uL (ref 1.4–7.0)
Neutrophils: 51 %
Platelets: 275 10*3/uL (ref 150–450)
RBC: 3.58 x10E6/uL — ABNORMAL LOW (ref 3.77–5.28)
RDW: 14.7 % (ref 11.7–15.4)
WBC: 5.1 10*3/uL (ref 3.4–10.8)

## 2022-04-29 LAB — CMP14+EGFR
ALT: 13 IU/L (ref 0–32)
AST: 26 IU/L (ref 0–40)
Albumin/Globulin Ratio: 1.7 (ref 1.2–2.2)
Albumin: 3.8 g/dL — ABNORMAL LOW (ref 3.9–4.9)
Alkaline Phosphatase: 99 IU/L (ref 44–121)
BUN/Creatinine Ratio: 10 (ref 9–23)
BUN: 8 mg/dL (ref 6–24)
Bilirubin Total: <0.2 mg/dL (ref 0.0–1.2)
CO2: 20 mmol/L (ref 20–29)
Calcium: 8.8 mg/dL (ref 8.7–10.2)
Chloride: 105 mmol/L (ref 96–106)
Creatinine, Ser: 0.78 mg/dL (ref 0.57–1.00)
Globulin, Total: 2.3 g/dL (ref 1.5–4.5)
Glucose: 81 mg/dL (ref 70–99)
Potassium: 5 mmol/L (ref 3.5–5.2)
Sodium: 138 mmol/L (ref 134–144)
Total Protein: 6.1 g/dL (ref 6.0–8.5)
eGFR: 93 mL/min/{1.73_m2} (ref >59)

## 2022-04-29 LAB — LIPID PANEL
Chol/HDL Ratio: 3.4 ratio (ref 0.0–4.4)
Cholesterol, Total: 217 mg/dL — ABNORMAL HIGH (ref 100–199)
HDL: 63 mg/dL (ref >39)
LDL Chol Calc (NIH): 132 mg/dL — ABNORMAL HIGH (ref 0–99)
Triglycerides: 123 mg/dL (ref 0–149)
VLDL Cholesterol Cal: 22 mg/dL (ref 5–40)

## 2022-04-29 LAB — TSH+FREE T4
Free T4: 0.82 ng/dL (ref 0.82–1.77)
TSH: 0.558 u[IU]/mL (ref 0.450–4.500)

## 2022-04-29 LAB — HEMOGLOBIN A1C
Est. average glucose Bld gHb Est-mCnc: 108 mg/dL
Hgb A1c MFr Bld: 5.4 % (ref 4.8–5.6)

## 2022-04-29 LAB — VITAMIN D 25 HYDROXY (VIT D DEFICIENCY, FRACTURES): Vit D, 25-Hydroxy: 43.7 ng/mL (ref 30.0–100.0)

## 2022-05-01 LAB — NUSWAB VAGINITIS PLUS (VG+)
Candida albicans, NAA: NEGATIVE
Candida glabrata, NAA: NEGATIVE
Chlamydia trachomatis, NAA: NEGATIVE
Neisseria gonorrhoeae, NAA: NEGATIVE
Trich vag by NAA: NEGATIVE

## 2022-05-01 NOTE — Progress Notes (Signed)
Please inform the patient that her NuSwab indicates that she does not have bacterial vaginosis.  She tested negative for yeast, trichomonas, chlamydia and gonorrhea

## 2022-05-02 NOTE — Progress Notes (Signed)
I recommend wearing cotton underwear that is breathable and comfortable. I also recommend wearing panty liners to absorb excess moisture. I encouraged the patient to change her panty liners regularly to prevent rashes or infections. Encouraged the patient to keep the genital area clean and dry and avoid using harsh soap or fragrance products. Only use water to clean the vagina.

## 2022-05-21 DIAGNOSIS — H524 Presbyopia: Secondary | ICD-10-CM | POA: Diagnosis not present

## 2022-05-21 DIAGNOSIS — H52223 Regular astigmatism, bilateral: Secondary | ICD-10-CM | POA: Diagnosis not present

## 2022-05-29 ENCOUNTER — Encounter: Payer: Medicaid Other | Admitting: Licensed Clinical Social Worker

## 2022-05-31 ENCOUNTER — Ambulatory Visit (INDEPENDENT_AMBULATORY_CARE_PROVIDER_SITE_OTHER): Payer: 59 | Admitting: Licensed Clinical Social Worker

## 2022-05-31 DIAGNOSIS — F411 Generalized anxiety disorder: Secondary | ICD-10-CM | POA: Diagnosis not present

## 2022-06-04 NOTE — BH Specialist Note (Signed)
Integrated Behavioral Health via Telemedicine Visit  06/04/2022 Molly Murillo [id number]  Number of Integrated Behavioral Health Clinician visits: 1 Session Start time:  11:00am Session End time: 11:45am Total time in minutes: 45 mins via mychart video   Referring Provider:  Sharen Hones NP Patient/Family location: home  [facility] Provider location: [facility]  All persons participating in visit: Molly Murillo and Molly Murillo  Types of Service: Individual psychotherapy and Video visit  I connected with Molly Murillo and/or Molly Murillo's n/a via  Telephone or Video Enabled Telemedicine Application  (Video is Caregility application) and verified that I am speaking with the correct person using two identifiers. Discussed confidentiality: Yes   I discussed the limitations of telemedicine and the availability of in person appointments.  Discussed there is a possibility of technology failure and discussed alternative modes of communication if that failure occurs.  I discussed that engaging in this telemedicine visit, they consent to the provision of behavioral healthcare and the services will be billed under their insurance.  Patient and/or legal guardian expressed understanding and consented to Telemedicine visit: Yes   Presenting Concerns: Patient and/or family reports the following symptoms/concerns: anxiety Duration of problem: over one year; Severity of problem: mild  Patient and/or Family's Strengths/Protective Factors: Concrete supports in place (healthy food, safe environments, etc.)  Goals Addressed: Patient will:  Reduce symptoms of: anxiety   Increase knowledge and/or ability of: coping skills   Demonstrate ability to: Increase healthy adjustment to current life circumstances  Progress towards Goals: Ongoing  Interventions: Interventions utilized:  Motivational Interviewing and Supportive Counseling Standardized Assessments completed: Not Needed  Patient and/or Family  Response: Molly Murillo reports history of anxiety. Molly Murillo reports limited social and family support. Molly Murillo was tearful during mychart visit. Molly Murillo denies thought of self harm and suicidal ideation.   Assessment: Patient currently experiencing anxiety.   Patient may benefit from integrated behavioral health.  Plan: Follow up with behavioral health clinician on : 2-3 weeks via mychart  Behavioral recommendations: meditation, deep breathing exercise, engage in self care   Referral(s): Integrated [facility] (In Clinic)  I discussed the assessment and treatment plan with the patient and/or parent/guardian. They were provided an opportunity to ask questions and all were answered. They agreed with the plan and demonstrated an understanding of the instructions.   They were advised to call back or seek an in-person evaluation if the symptoms worsen or if the condition fails to improve as anticipated.  Molly Murillo, Molly

## 2022-06-21 ENCOUNTER — Ambulatory Visit (INDEPENDENT_AMBULATORY_CARE_PROVIDER_SITE_OTHER): Payer: 59 | Admitting: Licensed Clinical Social Worker

## 2022-06-21 DIAGNOSIS — F411 Generalized anxiety disorder: Secondary | ICD-10-CM

## 2022-06-25 NOTE — BH Specialist Note (Signed)
Integrated Behavioral Health via Telemedicine Visit  06/25/2022 Molly Murillo [id number]  Number of Integrated Behavioral Health Clinician visits: 2 Session Start time:   10:00am Session End time: 10:35am Total time in minutes: 35 mins via phone   Referring Provider: Patricia Nettle NP  Patient/Family location: Home  [facility] Provider location: [facility]  All persons participating in visit: Pt Molly Murillo and LCSW Molly Murillo  Types of Service: Individual psychotherapy and Telephone visit  I connected with Molly Murillo and/or Molly Murillo n/Molly via  Telephone or Video Enabled Telemedicine Application  (Video is Caregility application) and verified that I am speaking with the correct person using two identifiers. Discussed confidentiality: Yes   I discussed the limitations of telemedicine and the availability of in person appointments.  Discussed there is Molly possibility of technology failure and discussed alternative modes of communication if that failure occurs.  I discussed that engaging in this telemedicine visit, they consent to the provision of behavioral healthcare and the services will be billed under their insurance.  Patient and/or legal guardian expressed understanding and consented to Telemedicine visit: Yes   Presenting Concerns: Patient and/or family reports the following symptoms/concerns: general anxiety disorder  Duration of problem: ; Severity of problem: mild  Patient and/or Family's Strengths/Protective Factors: Concrete supports in place (healthy food, safe environments, etc.)  Goals Addressed: Patient will:  Reduce symptoms of: anxiety   Increase knowledge and/or ability of: coping skills   Demonstrate ability to: Increase healthy adjustment to current life circumstances  Progress towards Goals: Ongoing  Interventions: Interventions utilized:  Supportive Counseling Standardized Assessments completed: Not Needed  Patient and/or Family Response: Ms. Grape reports  feeling anxious, worry, crying and negative thought patterns. [name] is decreasing social isolation and engaging in more social activities.   Assessment: Patient currently experiencing general anxiety disorder .   Patient may benefit from integrated behavioral health.  Plan: Follow up with behavioral health clinician on : 07/09/22 Behavioral recommendations: meditation, deep breathing exercise, engage in self care meditation, deep breathing exercise, engage in self care  Referral(s): Integrated [employer] (In Clinic)  I discussed the assessment and treatment plan with the patient and/or parent/guardian. They were provided an opportunity to ask questions and all were answered. They agreed with the plan and demonstrated an understanding of the instructions.   They were advised to call back or seek an in-person evaluation if the symptoms worsen or if the condition fails to improve as anticipated.  Gwyndolyn Saxon, LCSW

## 2022-07-05 ENCOUNTER — Encounter: Payer: Self-pay | Admitting: Family Medicine

## 2022-07-06 NOTE — Telephone Encounter (Signed)
Kindly place a referral to pain management and encourage the patient to f/u with gyn about the epidermal patch

## 2022-07-09 ENCOUNTER — Other Ambulatory Visit: Payer: Self-pay

## 2022-07-09 DIAGNOSIS — M545 Low back pain, unspecified: Secondary | ICD-10-CM

## 2022-07-30 ENCOUNTER — Encounter: Payer: Self-pay | Admitting: Family Medicine

## 2022-07-30 ENCOUNTER — Ambulatory Visit (INDEPENDENT_AMBULATORY_CARE_PROVIDER_SITE_OTHER): Payer: 59 | Admitting: Family Medicine

## 2022-07-30 VITALS — BP 113/74 | HR 82 | Ht 66.0 in | Wt 159.1 lb

## 2022-07-30 DIAGNOSIS — M25474 Effusion, right foot: Secondary | ICD-10-CM

## 2022-07-30 DIAGNOSIS — G44209 Tension-type headache, unspecified, not intractable: Secondary | ICD-10-CM | POA: Diagnosis not present

## 2022-07-30 DIAGNOSIS — E038 Other specified hypothyroidism: Secondary | ICD-10-CM

## 2022-07-30 DIAGNOSIS — E559 Vitamin D deficiency, unspecified: Secondary | ICD-10-CM

## 2022-07-30 DIAGNOSIS — M25542 Pain in joints of left hand: Secondary | ICD-10-CM | POA: Diagnosis not present

## 2022-07-30 DIAGNOSIS — R7301 Impaired fasting glucose: Secondary | ICD-10-CM | POA: Diagnosis not present

## 2022-07-30 DIAGNOSIS — G930 Cerebral cysts: Secondary | ICD-10-CM

## 2022-07-30 DIAGNOSIS — M25541 Pain in joints of right hand: Secondary | ICD-10-CM

## 2022-07-30 DIAGNOSIS — M25475 Effusion, left foot: Secondary | ICD-10-CM

## 2022-07-30 DIAGNOSIS — M25471 Effusion, right ankle: Secondary | ICD-10-CM

## 2022-07-30 DIAGNOSIS — M25472 Effusion, left ankle: Secondary | ICD-10-CM | POA: Diagnosis not present

## 2022-07-30 DIAGNOSIS — E7849 Other hyperlipidemia: Secondary | ICD-10-CM

## 2022-07-30 MED ORDER — NAPROXEN 375 MG PO TABS: 375.0000 mg | ORAL_TABLET | Freq: Two times a day (BID) | ORAL | 3 refills | Status: DC

## 2022-07-30 MED ORDER — NAPROXEN 375 MG PO TABS: 375.0000 mg | ORAL_TABLET | Freq: Two times a day (BID) | ORAL | 3 refills | Status: DC | PRN

## 2022-07-30 MED ORDER — TIZANIDINE HCL 4 MG PO TABS: 4.0000 mg | ORAL_TABLET | Freq: Every evening | ORAL | 0 refills | Status: DC

## 2022-07-30 MED ORDER — TIZANIDINE HCL 4 MG PO TABS
4.0000 mg | ORAL_TABLET | Freq: Four times a day (QID) | ORAL | 0 refills | Status: DC | PRN
Start: 2022-07-30 — End: 2022-07-30

## 2022-07-30 NOTE — Patient Instructions (Addendum)
I appreciate the opportunity to provide care to you today!    Follow up:  3 months  Labs: please stop by the lab today/during the week to get your blood drawn (CBC, CMP, TSH, Lipid profile, HgA1c, Vit D)  Swelling I recommend daily elevation of 20-30 minutes above level of heart, daily compression stocking use 20-15mmhg, exercise, refraining from prolonged sitting or standing.   Also recommend limiting your intake of high sodium foods such as fast foods and processed foods   I recommend contacting your neurologist as soon as possible Please stop by [facility] to get a CT scan of your head once scheduled to follow-up on the brain cyst  A prescription for tizanidine and and naproxen is sent to your pharmacy to take to relieve your headache I recommend taking tizanidine at bedtime because this medication can cause drowsiness and is not recommended to take with activities that require mental alertness   I recommend going to the ED for symptoms of Unrelenting headaches, or relief with conservative treatments or with pain that steadily worsens Lancinating, "ice-pick" head pain Severe headaches associated with a stiff neck and or fever Headache accompanied by her change in mentation or level of consciousness Persistent headache following trauma to the head or neck Headaches are significantly different in pattern or severity in the patient with a longstanding chronic headache history    Please review tension headache clinical reference attached your AVS for nonpharmacological management of your headache  Please continue to a heart-healthy diet and increase your physical activities. Try to exercise for at least five days a week.      It was a pleasure to see you and I look forward to continuing to work together on your health and well-being. Please do not hesitate to call the office if you need care or have questions about your care.   Have a wonderful day and week. With  Gratitude, Gilmore Laroche MSN, FNP-BC

## 2022-07-30 NOTE — Assessment & Plan Note (Signed)
She reports that she has been eating out more recently She admits to prolonged standing Denies symptoms of congestive heart failure No systematic symptoms reported  we will treat conservatively with daily elevation of 20-30 minutes above level of heart, daily compression stocking use 20-57mmhg, exercise, refraining from prolonged sitting or standing.   Encouraged to follow-up for worsening of her symptoms

## 2022-07-30 NOTE — Progress Notes (Signed)
Established Patient Office Visit  Subjective:  Patient ID: Molly Murillo, female    DOB: [birth year]  Age: 52 y.o. MRN: [id number]  CC:  Chief Complaint  Patient presents with   Chronic Care Management    3 month f/u, pt reports swelling in feet and ankles x 3-4 days, causing a stinging feeling. Reports headaches have been more than usual has been using goody powder.     HPI Molly Murillo is a 52 y.o. female with past medical history seizure, tobacco abuse, hypothyroidism, anxiety and depression of  presents for f/u of  chronic medical conditions. For the details of today's visit, please refer to the assessment and plan.      Past Medical History:  Diagnosis Date   Narcotic overdose (HCC)    Seizures (HCC)    Syncope     Past Surgical History:  Procedure Laterality Date   LEEP      Family History  Problem Relation Age of Onset   Diabetes Mother     Social History   Socioeconomic History   Marital status: Legally Separated    Spouse name: Not on file   Number of children: Not on file   Years of education: Not on file   Highest education level: Not on file  Occupational History   Not on file  Tobacco Use   Smoking status: Every Day    Packs/day: .5    Types: Cigarettes   Smokeless tobacco: Never  Vaping Use   Vaping Use: Former  Substance and Sexual Activity   Alcohol use: Yes    Comment: 1-2 beers per day   Drug use: No   Sexual activity: Yes    Birth control/protection: Condom  Other Topics Concern   Not on file  Social History Narrative   Not on file   Social Determinants of Health   Financial Resource Strain: Not on file  Food Insecurity: Not on file  Transportation Needs: Not on file  Physical Activity: Not on file  Stress: Not on file  Social Connections: Not on file  Intimate Partner Violence: Not on file    Outpatient Medications Prior to Visit  Medication Sig Dispense Refill   ciprofloxacin (CILOXAN) 0.3 % ophthalmic solution  Administer 1 drop every 4 hours  while awake for the next 5 days. 5 mL 0   folic acid (FOLVITE) 1 MG tablet Take 1 tablet (1 mg total) by mouth daily. 90 tablet 0   gabapentin (NEURONTIN) 100 MG capsule Take 1 capsule by mouth twice daily 60 capsule 0   levETIRAcetam (KEPPRA) 750 MG tablet Take 1 tablet (750 mg total) by mouth 2 (two) times daily. 180 tablet 4   levothyroxine (SYNTHROID) 25 MCG tablet Take 1 tablet (25 mcg total) by mouth daily. 90 tablet 3   magic mouthwash (lidocaine, diphenhydrAMINE, alum & mag hydroxide) suspension Swish and spit 5 mLs 3 (three) times daily as needed for mouth pain. 360 mL 0   omega-3 acid ethyl esters (LOVAZA) 1 g capsule Take 1 capsule (1 g total) by mouth 2 (two) times daily. 90 capsule 2   omeprazole (PRILOSEC) 40 MG capsule Take 1 capsule (40 mg total) by mouth daily. 90 capsule 2   thiamine (VITAMIN B1) 100 MG tablet Take 1 tablet (100 mg total) by mouth daily. 90 tablet 1   cyclobenzaprine (FLEXERIL) 5 MG tablet Take 1 tablet (5 mg total) by mouth 3 (three) times daily as needed for muscle spasms. 30 tablet 1  naproxen (NAPROSYN) 375 MG tablet Take 1 tablet (375 mg total) by mouth 2 (two) times daily with a meal. 30 tablet 3   No facility-administered medications prior to visit.    No Known Allergies  ROS Review of Systems  Constitutional:  Negative for chills and fever.  Eyes:  Negative for visual disturbance.  Respiratory:  Negative for cough, choking, chest tightness, shortness of breath and wheezing.   Cardiovascular:  Positive for leg swelling. Negative for chest pain and palpitations.  Neurological:  Positive for headaches. Negative for dizziness.      Objective:    Physical Exam HENT:     Head: Normocephalic.     Mouth/Throat:     Mouth: Mucous membranes are moist.  Cardiovascular:     Rate and Rhythm: Normal rate.     Heart sounds: Normal heart sounds.  Pulmonary:     Effort: Pulmonary effort is normal.     Breath sounds:  Normal breath sounds.  Musculoskeletal:     Right lower leg: Edema (mild) present.     Left lower leg: Edema (mild) present.  Skin:    Coloration: Skin is not pale.  Neurological:     Mental Status: She is alert and oriented to person, place, and time.     GCS: GCS eye subscore is 4. GCS verbal subscore is 5. GCS motor subscore is 6.     Cranial Nerves: No facial asymmetry.     Sensory: Sensation is intact.     Motor: No weakness or tremor.     Coordination: Romberg sign positive. Coordination abnormal.     BP 113/74   Pulse 82   Ht 5\' 6"  (1.676 m)   Wt 159 lb 1.9 oz (72.2 kg)   SpO2 98%   BMI 25.68 kg/m  Wt Readings from Last 3 Encounters:  07/30/22 159 lb 1.9 oz (72.2 kg)  04/27/22 159 lb 0.6 oz (72.1 kg)  01/25/22 165 lb 0.6 oz (74.9 kg)    Lab Results  Component Value Date   TSH 0.558 04/27/2022   Lab Results  Component Value Date   WBC 5.1 04/27/2022   HGB 11.5 04/27/2022   HCT 34.6 04/27/2022   MCV 97 04/27/2022   PLT 275 04/27/2022   Lab Results  Component Value Date   NA 138 04/27/2022   K 5.0 04/27/2022   CO2 20 04/27/2022   GLUCOSE 81 04/27/2022   BUN 8 04/27/2022   CREATININE 0.78 04/27/2022   BILITOT <0.2 04/27/2022   ALKPHOS 99 04/27/2022   AST 26 04/27/2022   ALT 13 04/27/2022   PROT 6.1 04/27/2022   ALBUMIN 3.8 (L) 04/27/2022   CALCIUM 8.8 04/27/2022   ANIONGAP 12 05/05/2021   EGFR 93 04/27/2022   Lab Results  Component Value Date   CHOL 217 (H) 04/27/2022   Lab Results  Component Value Date   HDL 63 04/27/2022   Lab Results  Component Value Date   LDLCALC 132 (H) 04/27/2022   Lab Results  Component Value Date   TRIG 123 04/27/2022   Lab Results  Component Value Date   CHOLHDL 3.4 04/27/2022   Lab Results  Component Value Date   HGBA1C 5.4 04/27/2022      Assessment & Plan:  Tension headache Assessment & Plan: Complains of a achy nagging headache that starts at the nape of her neck to her shoulders Of note the  patient reports that she has been under immense stress recently The headache usually last for  30 minutes and is relieved with Marlin Canary powders She reports taking 6 tablets of 200 mg Goody powder 3-4 times daily for symptom relief Reports impaired balance with the headache No visual disturbances reported Of note 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 and last imaging study was completed in 2022 We will get a repeat CT scan of the head today for follow-up The patient admits to smoking 5 cigarettes daily and drinking occasionally Inform the patient that smoking drinking can intensify her symptoms Encouraged meditation, listening to music, exercise and to decrease stress Will treat tension headache today with tizanidine 4 mg qhs and naproxen 375 mg BID as needed Encouraged to drink at least 64 ounces of water daily Reviewed red flag symptoms of headaches and encouraged to report to the ED if having the symptoms discussed   Orders: -     CT HEAD WO CONTRAST ( ) -     tiZANidine HCl; Take 1 tablet (4 mg total) by mouth at bedtime.  Dispense: 30 tablet; Refill: 0  Joint pain in both hands -     Naproxen; Take 1 tablet (375 mg total) by mouth 2 (two) times daily as needed.  Dispense: 60 tablet; Refill: 3  Bilateral swelling of feet and ankles Assessment & Plan: She reports that she has been eating out more recently She admits to prolonged standing Denies symptoms of congestive heart failure No systematic symptoms reported  we will treat conservatively with daily elevation of 20-30 minutes above level of heart, daily compression stocking use 20-63mmhg, exercise, refraining from prolonged sitting or standing.   Encouraged to follow-up for worsening of her symptoms    Brain cyst -     CT HEAD WO CONTRAST ( )  IFG (impaired fasting glucose) -     Hemoglobin A1c  Vitamin D deficiency -     VITAMIN D 25 Hydroxy (Vit-D Deficiency, Fractures)  Other specified hypothyroidism -     TSH +  free T4  Other hyperlipidemia -     Lipid panel -     CMP14+EGFR -     CBC with Differential/Platelet    Follow-up: Return in about 3 months (around 10/30/2022).   Gilmore Laroche, FNP

## 2022-07-30 NOTE — Assessment & Plan Note (Addendum)
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

## 2022-08-06 ENCOUNTER — Encounter: Payer: Self-pay | Admitting: Family Medicine

## 2022-08-06 ENCOUNTER — Other Ambulatory Visit: Payer: Self-pay

## 2022-08-06 DIAGNOSIS — G44209 Tension-type headache, unspecified, not intractable: Secondary | ICD-10-CM

## 2022-08-16 ENCOUNTER — Other Ambulatory Visit: Payer: Self-pay | Admitting: Neurology

## 2022-10-29 ENCOUNTER — Institutional Professional Consult (permissible substitution): Payer: 59 | Admitting: Neurology

## 2022-10-29 ENCOUNTER — Encounter: Payer: Self-pay | Admitting: Neurology

## 2022-10-29 ENCOUNTER — Telehealth: Payer: Self-pay | Admitting: Neurology
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
## 2022-10-30 ENCOUNTER — Ambulatory Visit: Payer: 59 | Admitting: Family Medicine

## 2022-10-31 ENCOUNTER — Encounter: Payer: Self-pay | Admitting: Family Medicine

## 2022-11-13 IMAGING — CT CT HEAD W/O CM
3 series · 16 of 47 positions shown, 19 images · non-contrast
Comparison: 05/27/2020, 03/11/2020

CLINICAL DATA: Follow-up brain cyst

EXAM:
CT HEAD WITHOUT CONTRAST
TECHNIQUE: Contiguous axial images were obtained from the base of the skull
through the vertex without intravenous contrast.

[Series 2: head wo · axial · 0.41mm/px · z∈[-212,-72]mm · 10 of 34 slices shown, 13 images]
[im 3/34  brain]
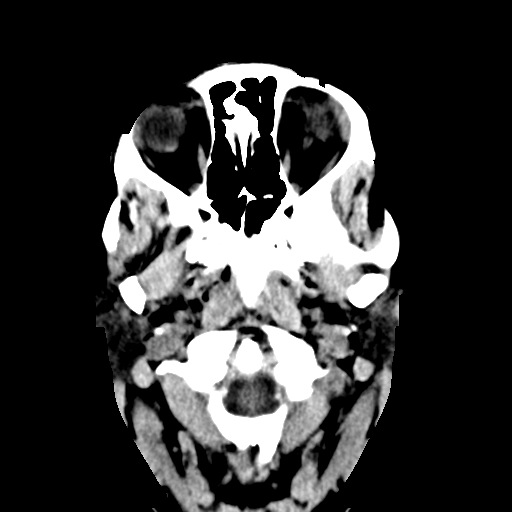
[im 3/34  bone]
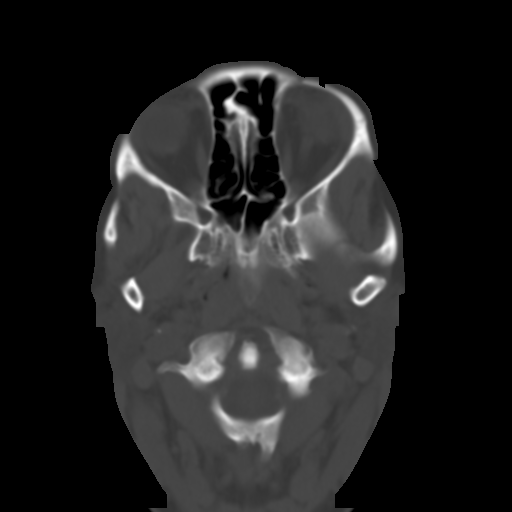
[im 6/34  brain]
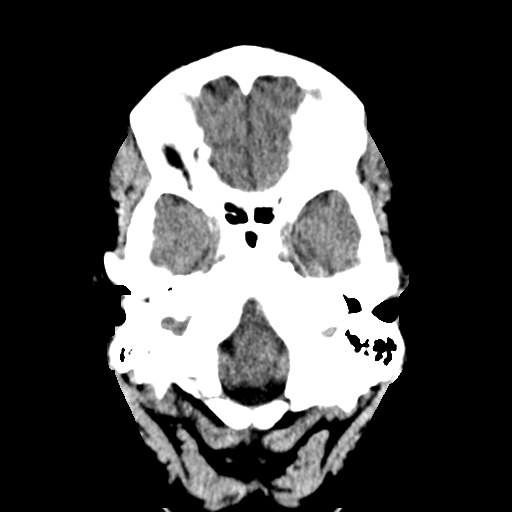
[im 10/34  brain]
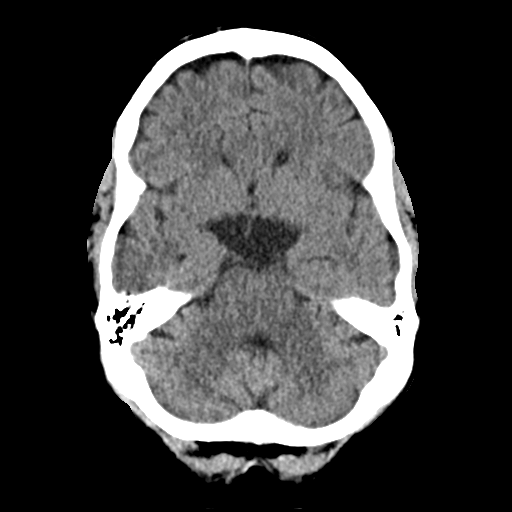
[im 12/34  brain]
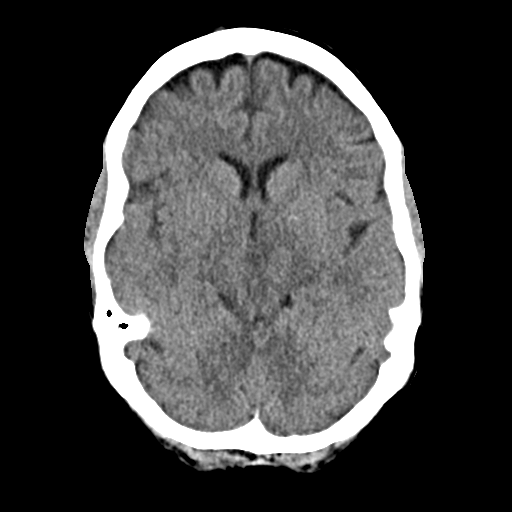
[im 15/34  brain]
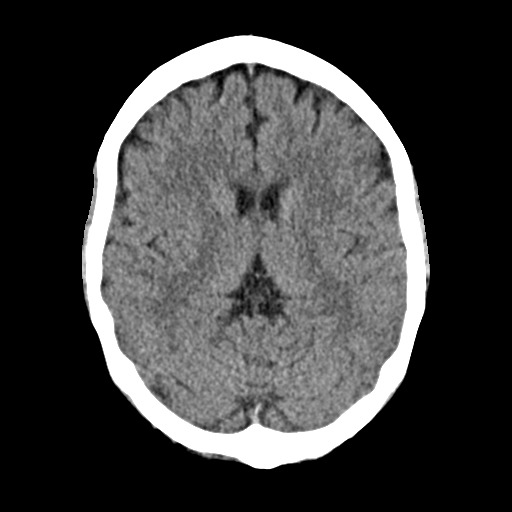
[im 15/34  bone]
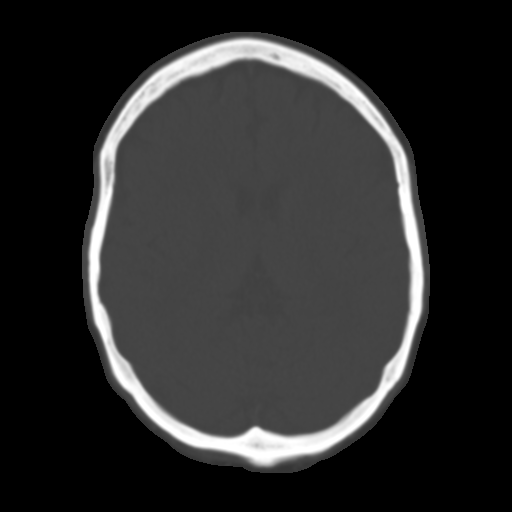
[im 19/34  brain]
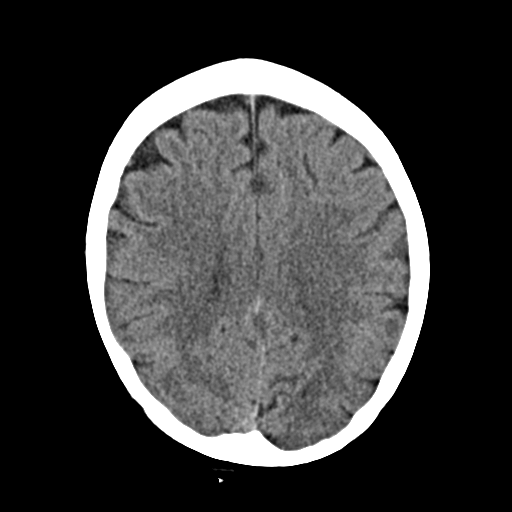
[im 22/34  brain]
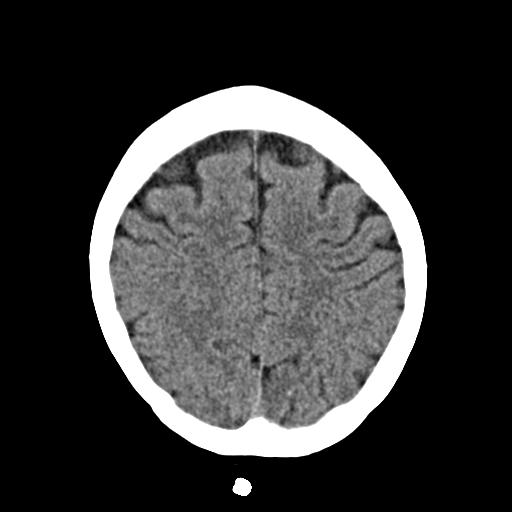
[im 26/34  brain]
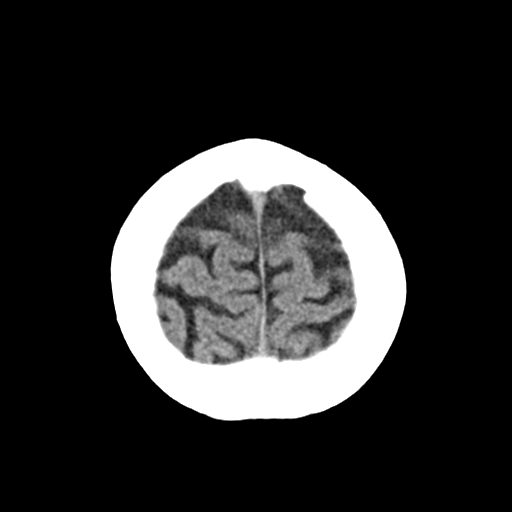
[im 28/34  brain]
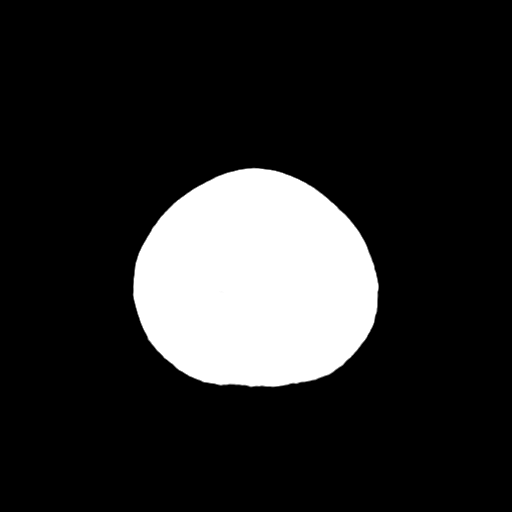
[im 28/34  bone]
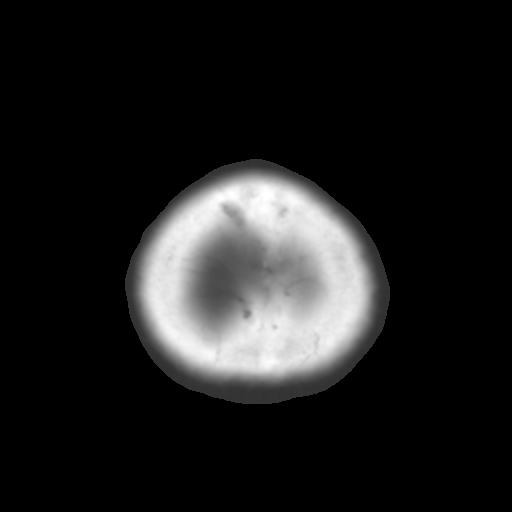
[im 31/34  brain]
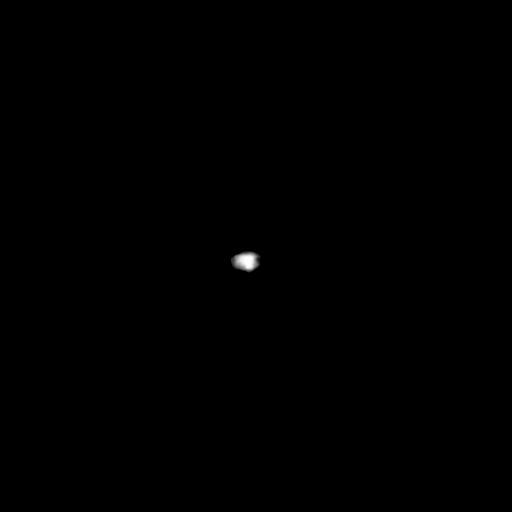

[Series 4: coronal soft · coronal · 0.33mm/px · 3 of 67 slices shown]
[im 23/67  brain]
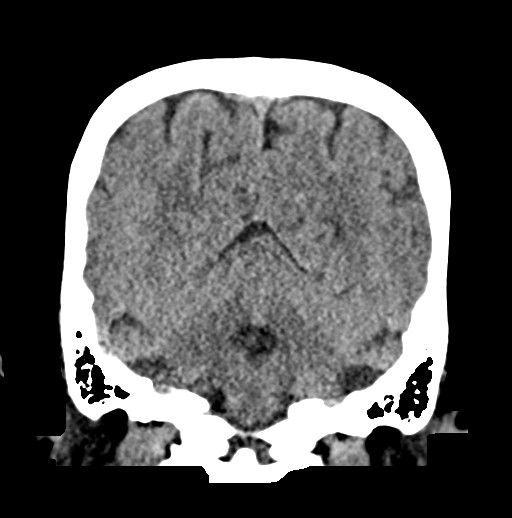
[im 30/67  brain]
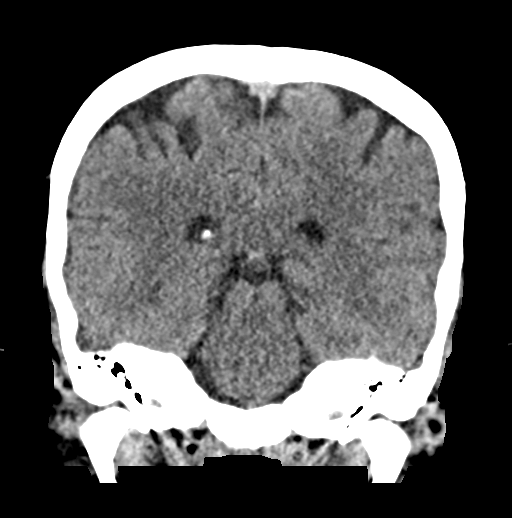
[im 37/67  brain]
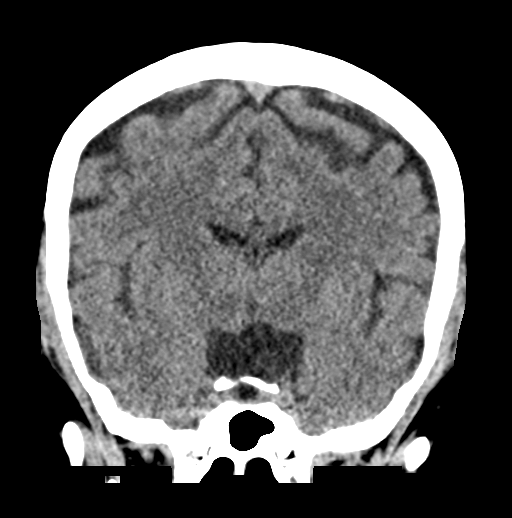

[Series 5: sag soft · sagittal · 0.32mm/px · 3 of 54 slices shown]
[im 18/54  brain]
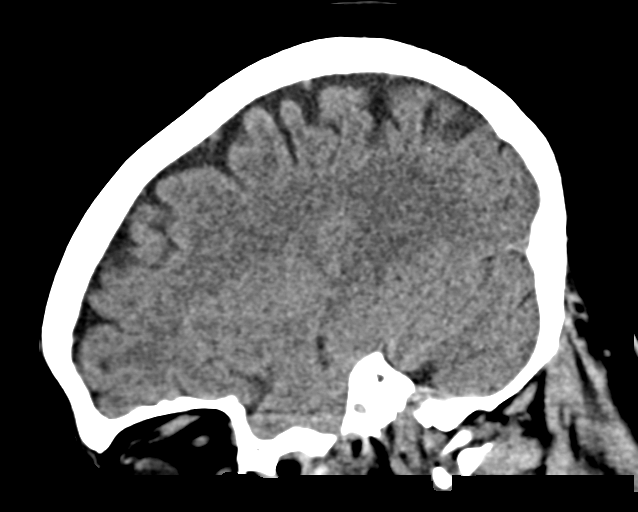
[im 27/54  brain]
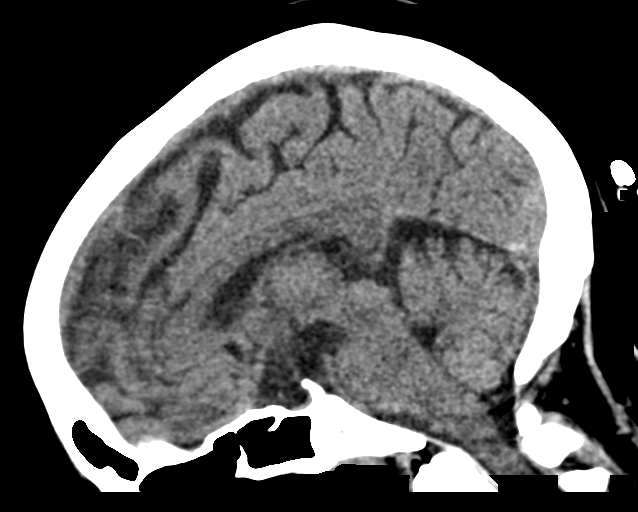
[im 36/54  brain]
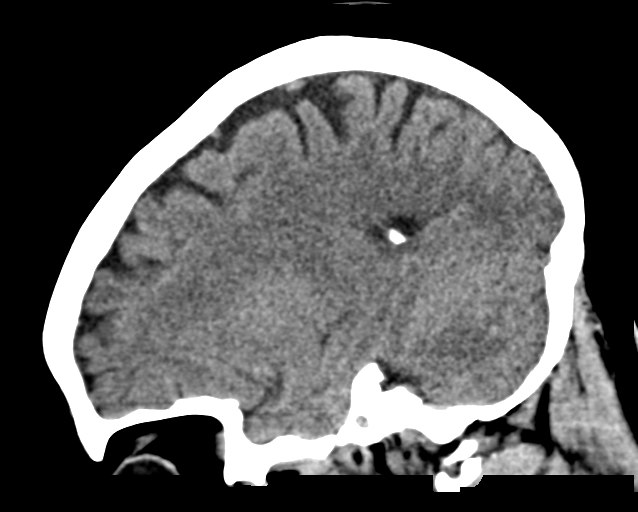

[16 of 47 positions shown; findings below may reference images not displayed]

FINDINGS: Brain: Redemonstrated suprasellar low-density lesion, measuring
approximately 2.2 x 3.5 x 3.0 cm (AP x TR x CC), unchanged when
remeasured similarly, consistent with known epidermoid cyst.
Unchanged mass effect on the hypothalamus in floor of the third
ventricle.

No acute infarct, hemorrhage, cerebral edema, or midline shift.
Ventricles and sulci are normal for age. No hydrocephalus. No
extra-axial fluid collection.

Vascular: No hyperdense vessel or unexpected calcification.

Skull: Normal. Negative for fracture or focal lesion.

Sinuses/Orbits: No acute finding.

Other: The mastoids are well aerated.
IMPRESSION: 1. Unchanged appearance of known suprasellar cistern epidermoid
cyst.
2. No acute intracranial process.

## 2022-12-31 ENCOUNTER — Ambulatory Visit: Payer: 59 | Admitting: Neurology

## 2023-01-01 ENCOUNTER — Other Ambulatory Visit: Payer: Self-pay | Admitting: Family Medicine

## 2023-01-01 ENCOUNTER — Ambulatory Visit: Payer: Medicaid Other | Admitting: Family Medicine

## 2023-01-01 VITALS — BP 125/85 | HR 110 | Resp 16 | Ht 66.0 in | Wt 164.0 lb

## 2023-01-01 DIAGNOSIS — M7918 Myalgia, other site: Secondary | ICD-10-CM

## 2023-01-01 DIAGNOSIS — Z23 Encounter for immunization: Secondary | ICD-10-CM

## 2023-01-01 DIAGNOSIS — L0100 Impetigo, unspecified: Secondary | ICD-10-CM

## 2023-01-01 DIAGNOSIS — E038 Other specified hypothyroidism: Secondary | ICD-10-CM | POA: Diagnosis not present

## 2023-01-01 DIAGNOSIS — K219 Gastro-esophageal reflux disease without esophagitis: Secondary | ICD-10-CM

## 2023-01-01 MED ORDER — LEVOTHYROXINE SODIUM 25 MCG PO TABS
25.0000 ug | ORAL_TABLET | Freq: Every day | ORAL | 3 refills | Status: DC
Start: 2023-01-01 — End: 2023-10-25

## 2023-01-01 MED ORDER — CEPHALEXIN 500 MG PO CAPS: 500.0000 mg | ORAL_CAPSULE | Freq: Four times a day (QID) | ORAL | 0 refills | Status: AC

## 2023-01-01 MED ORDER — MUPIROCIN 2 % EX OINT
1.0000 | TOPICAL_OINTMENT | Freq: Two times a day (BID) | CUTANEOUS | 0 refills | Status: AC
Start: 2023-01-01 — End: ?

## 2023-01-01 MED ORDER — NAPROXEN 375 MG PO TABS: 375.0000 mg | ORAL_TABLET | Freq: Two times a day (BID) | ORAL | 3 refills | Status: DC | PRN

## 2023-01-01 NOTE — Assessment & Plan Note (Addendum)
The patient was encouraged to follow up with dermatology on 04/15/2023. Today, treatment will include Bactroban topical antibiotic cream, along with a 10-day course of oral antibiotics with Keflex.  Good Hygiene: Gently wash the affected area with soap and water to remove crusts and oozing, but avoid scrubbing to prevent irritation. Keep the area clean and covered with a sterile bandage to prevent the infection from spreading. Wash hands thoroughly after touching the rash. -Preventing Spread: Since impetigo is highly contagious, avoid close contact with others until the infection has healed or after being on antibiotics for at least 24 hours. Avoid sharing towels, bedding, or clothing until the infection is treated. Soothing the Skin: Applying a cool compress or antiseptic ointment can help soothe the area

## 2023-01-01 NOTE — Assessment & Plan Note (Signed)
Patient educated on CDC recommendation for the vaccine. Verbal consent was obtained from the patient, vaccine administered by nurse, no sign of adverse reactions noted at this time. Patient education on arm soreness and use of tylenol or ibuprofen for this patient  was discussed. Patient educated on the signs and symptoms of adverse effect and advise to contact the office if they occur.  

## 2023-01-01 NOTE — Progress Notes (Signed)
Established Patient Office Visit  Subjective:  Patient ID: Molly Murillo, female    DOB: 04/04/1970  Age: 52 y.o. MRN: [id number]  CC:  Chief Complaint  Patient presents with   Referral    Wants to be referred again to pain management    Rash    Has spots on legs that are not getting better. Was diagnosed before with impetigo. Did remove ticks over the summer near the areas    HPI Molly Murillo is a 52 y.o. female presents for a referral to pain management and complaints of a rash on her lower extremity.  Musculoskeletal Pain: The patient reports tightness in her neck and shoulders, rating the pain at 6 out of 10. She notes improvement with naproxen. She denies any numbness, tingling, recent injury, trauma, fever, chills, shortness of breath, unintentional weight loss, loss of balance, or coordination.  Rash: The patient mentions a history of impetigo as a child. She currently complains of numerous very itchy sores on her right lower extremity that are swollen and filled with fluid. She denies fever or chills.   Past Medical History:  Diagnosis Date   Narcotic overdose (HCC)    Seizures (HCC)    Syncope     Past Surgical History:  Procedure Laterality Date   LEEP      Family History  Problem Relation Age of Onset   Diabetes Mother     Social History   Socioeconomic History   Marital status: Legally Separated    Spouse name: Not on file   Number of children: Not on file   Years of education: Not on file   Highest education level: Not on file  Occupational History   Not on file  Tobacco Use   Smoking status: Every Day    Current packs/day: 0.50    Types: Cigarettes   Smokeless tobacco: Never  Vaping Use   Vaping status: Former  Substance and Sexual Activity   Alcohol use: Yes    Comment: 1-2 beers per day   Drug use: No   Sexual activity: Yes    Birth control/protection: Condom  Other Topics Concern   Not on file  Social History Narrative   Not on  file   Social Determinants of Health   Financial Resource Strain: Low Risk  (11/29/2022)   Received from [facility]   Overall Financial Resource Strain (CARDIA)    Difficulty of Paying Living Expenses: Not hard at all  Food Insecurity: No Food Insecurity (11/29/2022)   Received from [facility]   Hunger Vital Sign    Worried About Running Out of Food in the Last Year: Never true    Ran Out of Food in the Last Year: Never true  Transportation Needs: No Transportation Needs (11/29/2022)   Received from [facility] - Transportation    Lack of Transportation (Medical): No    Lack of Transportation (Non-Medical): No  Physical Activity: Not on file  Stress: Not on file  Social Connections: Unknown (07/04/2021)   Received from [facility]   Social Network    Social Network: Not on file  Intimate Partner Violence: Unknown (05/26/2021)   Received from [facility]   HITS    Physically Hurt: Not on file    Insult or Talk Down To: Not on file    Threaten Physical Harm: Not on file    Scream or Curse: Not on file    Outpatient Medications Prior  to Visit  Medication Sig Dispense Refill   ciprofloxacin (CILOXAN) 0.3 % ophthalmic solution Administer 1 drop every 4 hours  while awake for the next 5 days. 5 mL 0   folic acid (FOLVITE) 1 MG tablet Take 1 tablet (1 mg total) by mouth daily. 90 tablet 0   gabapentin (NEURONTIN) 100 MG capsule Take 1 capsule by mouth twice daily 180 capsule 1   levETIRAcetam (KEPPRA) 750 MG tablet Take 1 tablet (750 mg total) by mouth 2 (two) times daily. 180 tablet 4   magic mouthwash (lidocaine, diphenhydrAMINE, alum & mag hydroxide) suspension Swish and spit 5 mLs 3 (three) times daily as needed for mouth pain. 360 mL 0   omega-3 acid ethyl esters (LOVAZA) 1 g capsule Take 1 capsule (1 g total) by mouth 2 (two) times daily. 90 capsule 2   omeprazole (PRILOSEC) 40 MG capsule Take 1 capsule by mouth once daily 30  capsule 0   thiamine (VITAMIN B1) 100 MG tablet Take 1 tablet (100 mg total) by mouth daily. 90 tablet 1   tiZANidine (ZANAFLEX) 4 MG tablet Take 1 tablet (4 mg total) by mouth at bedtime. 30 tablet 0   levothyroxine (SYNTHROID) 25 MCG tablet Take 1 tablet (25 mcg total) by mouth daily. 90 tablet 3   naproxen (NAPROSYN) 375 MG tablet Take 1 tablet (375 mg total) by mouth 2 (two) times daily as needed. 60 tablet 3   No facility-administered medications prior to visit.    No Known Allergies  ROS Review of Systems  Constitutional:  Negative for chills and fever.  Eyes:  Negative for visual disturbance.  Respiratory:  Negative for chest tightness and shortness of breath.   Musculoskeletal:  Positive for arthralgias and neck pain.  Skin:  Positive for rash.  Neurological:  Negative for dizziness and headaches.      Objective:    Physical Exam HENT:     Head: Normocephalic.     Mouth/Throat:     Mouth: Mucous membranes are moist.  Cardiovascular:     Rate and Rhythm: Normal rate.     Heart sounds: Normal heart sounds.  Pulmonary:     Effort: Pulmonary effort is normal.     Breath sounds: Normal breath sounds.  Skin:    Findings: Lesion present.  Neurological:     Mental Status: She is alert.     BP 125/85   Pulse (!) 110   Resp 16   Ht 5\' 6"  (1.676 m)   Wt 164 lb (74.4 kg)   SpO2 94%   BMI 26.47 kg/m  Wt Readings from Last 3 Encounters:  01/01/23 164 lb (74.4 kg)  07/30/22 159 lb 1.9 oz (72.2 kg)  04/27/22 159 lb 0.6 oz (72.1 kg)    Lab Results  Component Value Date   TSH 0.558 04/27/2022   Lab Results  Component Value Date   WBC 5.1 04/27/2022   HGB 11.5 04/27/2022   HCT 34.6 04/27/2022   MCV 97 04/27/2022   PLT 275 04/27/2022   Lab Results  Component Value Date   NA 138 04/27/2022   K 5.0 04/27/2022   CO2 20 04/27/2022   GLUCOSE 81 04/27/2022   BUN 8 04/27/2022   CREATININE 0.78 04/27/2022   BILITOT <0.2 04/27/2022   ALKPHOS 99 04/27/2022    AST 26 04/27/2022   ALT 13 04/27/2022   PROT 6.1 04/27/2022   ALBUMIN 3.8 (L) 04/27/2022   CALCIUM 8.8 04/27/2022   ANIONGAP 12 05/05/2021  EGFR 93 04/27/2022   Lab Results  Component Value Date   CHOL 217 (H) 04/27/2022   Lab Results  Component Value Date   HDL 63 04/27/2022   Lab Results  Component Value Date   LDLCALC 132 (H) 04/27/2022   Lab Results  Component Value Date   TRIG 123 04/27/2022   Lab Results  Component Value Date   CHOLHDL 3.4 04/27/2022   Lab Results  Component Value Date   HGBA1C 5.4 04/27/2022      Assessment & Plan:  Musculoskeletal pain Assessment & Plan: Encouraged to continue taking naproxen as needed for pain relief. Recommended heat and cold therapy to the affected site. Encouraged taking tizanidine at bedtime. Referral placed for physical therapy. Encouraged applying over-the-counter Advil Targeted Relief pain-relieving cream to the affected site.   Orders: -     Naproxen; Take 1 tablet (375 mg total) by mouth 2 (two) times daily as needed.  Dispense: 60 tablet; Refill: 3 -     Ambulatory referral to Physical Therapy  Impetigo Assessment & Plan: The patient was encouraged to follow up with dermatology on 04/15/2023. Today, treatment will include Bactroban topical antibiotic cream, along with a 10-day course of oral antibiotics with Keflex.  Good Hygiene: Gently wash the affected area with soap and water to remove crusts and oozing, but avoid scrubbing to prevent irritation. Keep the area clean and covered with a sterile bandage to prevent the infection from spreading. Wash hands thoroughly after touching the rash. -Preventing Spread: Since impetigo is highly contagious, avoid close contact with others until the infection has healed or after being on antibiotics for at least 24 hours. Avoid sharing towels, bedding, or clothing until the infection is treated. Soothing the Skin: Applying a cool compress or antiseptic ointment can  help soothe the area   Orders: -     Cephalexin; Take 1 capsule (500 mg total) by mouth 4 (four) times daily for 10 days.  Dispense: 40 capsule; Refill: 0 -     Mupirocin; Apply 1 Application topically 2 (two) times daily.  Dispense: 30 g; Refill: 0  Encounter for immunization Assessment & Plan: Patient educated on CDC recommendation for the vaccine. Verbal consent was obtained from the patient, vaccine administered by nurse, no sign of adverse reactions noted at this time. Patient education on arm soreness and use of tylenol or ibuprofen for this patient  was discussed. Patient educated on the signs and symptoms of adverse effect and advise to contact the office if they occur.   Orders: -     Flu vaccine trivalent PF, 6mos and older(Flulaval,Afluria,Fluarix,Fluzone)  Other specified hypothyroidism -     Levothyroxine Sodium; Take 1 tablet (25 mcg total) by mouth daily.  Dispense: 90 tablet; Refill: 3  Note: This chart has been completed using Engineer, civil (consulting) software, and while attempts have been made to ensure accuracy, certain words and phrases may not be transcribed as intended.    Follow-up: Return in about 4 months (around 05/01/2023).   Gilmore Laroche, FNP

## 2023-01-01 NOTE — Assessment & Plan Note (Signed)
Encouraged to continue taking naproxen as needed for pain relief. Recommended heat and cold therapy to the affected site. Encouraged taking tizanidine at bedtime. Referral placed for physical therapy. Encouraged applying over-the-counter Advil Targeted Relief pain-relieving cream to the affected site.

## 2023-01-01 NOTE — Patient Instructions (Addendum)
I appreciate the opportunity to provide care to you today!    Follow up:  4 months   Impetigo Impetigo is a highly contagious bacterial skin infection, typically caused by Streptococcus or Staphylococcus bacteria.  Topical Antibiotics:Mupirocin (Bactroban): This is the most commonly prescribed topical antibiotic for treating localized impetigo. It is applied directly to the affected area 2-3 times a day for about 7-10 days. Oral Antibiotics:Cephalexin (Keflex): Take 500 mg four times daily for 10 days  Good Hygiene:Wash the affected area gently with soap and water to remove crusts and oozing. Avoid scrubbing, as it can irritate the skin. Keep the affected area clean and covered with a sterile bandage to prevent spreading the infection. Wash your hands thoroughly after touching the rash. Preventing Spread: Since impetigo is highly contagious, it's important to avoid close contact with others until the infection has healed or the person has been on antibiotics for at least 24 hours. Avoid sharing towels, bedding, or clothing until the infection is treated. Soothing the Skin: Applying a cool compress or antiseptic ointment can help soothe the area, though this is not a treatment for the infection itself.   Apply over-the-counter Advil Targeted Relief pain-relieving cream to the affected site.   Referrals today-  PT  Attached with your AVS, you will find valuable resources for self-education. I highly recommend dedicating some time to thoroughly examine them.   Please continue to a heart-healthy diet and increase your physical activities. Try to exercise for at least five days a week.    It was a pleasure to see you and I look forward to continuing to work together on your health and well-being. Please do not hesitate to call the office if you need care or have questions about your care.  In case of emergency, please visit the Emergency Department for urgent care, or contact our  clinic at [phone] to schedule an appointment. We're here to help you!   Have a wonderful day and week. With Gratitude, Gilmore Laroche MSN, FNP-BC

## 2023-01-10 ENCOUNTER — Institutional Professional Consult (permissible substitution): Payer: 59 | Admitting: Neurology

## 2023-01-20 ENCOUNTER — Other Ambulatory Visit: Payer: Self-pay | Admitting: Family Medicine

## 2023-01-20 DIAGNOSIS — E538 Deficiency of other specified B group vitamins: Secondary | ICD-10-CM

## 2023-01-31 DIAGNOSIS — G9389 Other specified disorders of brain: Secondary | ICD-10-CM | POA: Insufficient documentation

## 2023-02-24 ENCOUNTER — Other Ambulatory Visit: Payer: Self-pay | Admitting: Family Medicine

## 2023-02-24 DIAGNOSIS — E538 Deficiency of other specified B group vitamins: Secondary | ICD-10-CM

## 2023-03-01 ENCOUNTER — Other Ambulatory Visit: Payer: Self-pay | Admitting: Family Medicine

## 2023-03-01 DIAGNOSIS — K219 Gastro-esophageal reflux disease without esophagitis: Secondary | ICD-10-CM

## 2023-03-01 DIAGNOSIS — M545 Low back pain, unspecified: Secondary | ICD-10-CM

## 2023-03-30 ENCOUNTER — Other Ambulatory Visit: Payer: Self-pay | Admitting: Family Medicine

## 2023-03-30 DIAGNOSIS — E538 Deficiency of other specified B group vitamins: Secondary | ICD-10-CM

## 2023-04-12 ENCOUNTER — Other Ambulatory Visit: Payer: Self-pay | Admitting: Family Medicine

## 2023-04-12 DIAGNOSIS — K219 Gastro-esophageal reflux disease without esophagitis: Secondary | ICD-10-CM

## 2023-04-15 ENCOUNTER — Ambulatory Visit (INDEPENDENT_AMBULATORY_CARE_PROVIDER_SITE_OTHER): Payer: Medicaid Other | Admitting: Dermatology

## 2023-04-15 ENCOUNTER — Encounter: Payer: Self-pay | Admitting: Dermatology

## 2023-04-15 DIAGNOSIS — L219 Seborrheic dermatitis, unspecified: Secondary | ICD-10-CM

## 2023-04-15 DIAGNOSIS — Z7189 Other specified counseling: Secondary | ICD-10-CM

## 2023-04-15 DIAGNOSIS — L905 Scar conditions and fibrosis of skin: Secondary | ICD-10-CM | POA: Diagnosis not present

## 2023-04-15 DIAGNOSIS — L700 Acne vulgaris: Secondary | ICD-10-CM

## 2023-04-15 DIAGNOSIS — L281 Prurigo nodularis: Secondary | ICD-10-CM

## 2023-04-15 DIAGNOSIS — Z79899 Other long term (current) drug therapy: Secondary | ICD-10-CM

## 2023-04-15 DIAGNOSIS — L280 Lichen simplex chronicus: Secondary | ICD-10-CM

## 2023-04-15 MED ORDER — CLINDAMYCIN PHOS-BENZOYL PEROX 1.2-5 % EX GEL: 1.0000 | Freq: Every day | CUTANEOUS | 2 refills | Status: DC

## 2023-04-15 MED ORDER — CLOBETASOL PROPIONATE 0.05 % EX OINT: 1.0000 | TOPICAL_OINTMENT | Freq: Two times a day (BID) | CUTANEOUS | 1 refills | Status: DC

## 2023-04-15 MED ORDER — PIMECROLIMUS 1 % EX CREA: TOPICAL_CREAM | CUTANEOUS | 3 refills | Status: DC

## 2023-04-15 MED ORDER — DOXYCYCLINE MONOHYDRATE 100 MG PO CAPS: 100.0000 mg | ORAL_CAPSULE | Freq: Two times a day (BID) | ORAL | 2 refills | Status: AC

## 2023-04-15 NOTE — Patient Instructions (Addendum)
 Recommend OTC Gold Bond Rapid Relief Anti-Itch cream (pramoxine + menthol), CeraVe Anti-itch cream or lotion (pramoxine), Sarna lotion (Original- menthol + camphor or Sensitive- pramoxine) or Eucerin 12 hour Itch Relief lotion (menthol) up to 3 times per day to areas on body that are itchy.   For the Doxycycline 100mg   Take 1 pill twice a day when acne flared, once flare resolved decrease to 1 pill a day.  Due to recent changes in healthcare laws, you may see results of your pathology and/or laboratory studies on MyChart before the doctors have had a chance to review them. We understand that in some cases there may be results that are confusing or concerning to you. Please understand that not all results are received at the same time and often the doctors may need to interpret multiple results in order to provide you with the best plan of care or course of treatment. Therefore, we ask that you please give [place] 2 business days to thoroughly review all your results before contacting the office for clarification. Should we see a critical lab result, you will be contacted sooner.   If You Need Anything After Your Visit  If you have any questions or concerns for your doctor, please call our main line at [phone] and press option 4 to reach your doctor's medical assistant. If no one answers, please leave a voicemail as directed and we will return your call as soon as possible. Messages left after 4 pm will be answered the following business day.   You may also send [place] a message via MyChart. We typically respond to MyChart messages within 1-2 business days.  For prescription refills, please ask your pharmacy to contact our office. Our fax number is [phone].  If you have an urgent issue when the clinic is closed that cannot wait until the next business day, you can page your doctor at the number below.    Please note that while we do our best to be available for urgent issues outside of office  hours, we are not available 24/7.   If you have an urgent issue and are unable to reach [place], you may choose to seek medical care at your doctor's office, retail clinic, urgent care center, or emergency room.  If you have a medical emergency, please immediately call 911 or go to the emergency department.  Pager Numbers  - Dr. Gwen Pounds: [phone]  - Dr. Roseanne Reno: [phone]  - Dr. Katrinka Blazing: [phone]   In the event of inclement weather, please call our main line at [phone] for an update on the status of any delays or closures.  Dermatology Medication Tips: Please keep the boxes that topical medications come in in order to help keep track of the instructions about where and how to use these. Pharmacies typically print the medication instructions only on the boxes and not directly on the medication tubes.   If your medication is too expensive, please contact our office at [phone] option 4 or send [place] a message through MyChart.   We are unable to tell what your co-pay for medications will be in advance as this is different depending on your insurance coverage. However, we may be [place] to find a substitute medication at lower cost or fill out paperwork to get insurance to cover a needed medication.   If a prior authorization is required to get your medication covered by your insurance company, please allow [place] 1-2 business days to complete this process.  Drug prices often vary depending  on where the prescription is filled and some pharmacies may offer cheaper prices.  The website www.goodrx.com contains coupons for medications through different pharmacies. The prices here do not account for what the cost may be with help from insurance (it may be cheaper with your insurance), but the website can give you the price if you did not use any insurance.  - You can print the associated coupon and take it with your prescription to the pharmacy.  - You may also stop by our office during  regular business hours and pick up a GoodRx coupon card.  - If you need your prescription sent electronically to a different pharmacy, notify our office through [facility] or by phone at [phone] option 4.     Si Usted Necesita Algo Despus de Su Visita  Tambin puede enviarnos un mensaje a travs de Clinical cytogeneticist. Por lo general respondemos a los mensajes de MyChart en el transcurso de 1 a 2 das hbiles.  Para renovar recetas, por favor pida a su farmacia que se ponga en contacto con nuestra oficina. Annie Sable de fax es [place] [phone].  Si tiene un asunto urgente cuando la clnica est cerrada y que no puede esperar hasta el siguiente da hbil, puede llamar/localizar a su doctor(a) al nmero que aparece a continuacin.   Por favor, tenga en cuenta que aunque hacemos todo lo posible para estar disponibles para asuntos urgentes fuera del horario de [place], no estamos disponibles las 24 horas del da, los 7 [address]  [address] de la [place].   Si tiene un problema urgente y no puede comunicarse con nosotros, puede optar por buscar atencin mdica  en el consultorio de su doctor(a), en una clnica privada, en un centro de atencin urgente o en una sala de emergencias.  Si tiene Engineer, drilling, por favor llame inmediatamente al 911 o vaya a la sala de emergencias.  Nmeros de bper  - Dr. Gwen Pounds: [phone]  - Dra. Roseanne Reno: [phone]  - Dr. Katrinka Blazing: [phone]   En caso de inclemencias del tiempo, por favor llame a Lacy Duverney principal al [phone] para una actualizacin sobre el [place] de cualquier retraso o cierre.  Consejos para la medicacin en dermatologa: Por favor, guarde las cajas en las que vienen los medicamentos de uso tpico para ayudarle a seguir las instrucciones sobre dnde y cmo usarlos. Las farmacias generalmente imprimen las instrucciones del medicamento slo en las cajas y no directamente en los tubos del [place].   Si su medicamento es muy  caro, por favor, pngase en contacto con Rolm Gala llamando al [phone] y presione la opcin 4 o envenos un mensaje a travs de Clinical cytogeneticist.   No podemos decirle cul ser su copago por los medicamentos por adelantado ya que esto es diferente dependiendo de la cobertura de su seguro. Sin embargo, es posible que podamos encontrar un medicamento sustituto a Audiological scientist un formulario para que el seguro cubra el medicamento que se considera necesario.   Si se requiere una autorizacin previa para que su compaa de seguros [place] su medicamento, por favor permtanos de 1 a 2 das hbiles para completar [address]  Los precios de los medicamentos varan con frecuencia dependiendo del Environmental consultant de dnde se surte la receta y alguna farmacias pueden ofrecer precios ms baratos.  El sitio web www.goodrx.com tiene cupones para medicamentos de Health and safety inspector. Los precios aqu no tienen en cuenta lo que podra costar con la ayuda del seguro (puede ser ms barato con su seguro),  pero el sitio web puede darle el precio si no Visual merchandiser.  - Puede imprimir el cupn correspondiente y llevarlo con su receta a la farmacia.  - Tambin puede pasar por nuestra oficina durante el horario de atencin regular y Education officer, museum una tarjeta de cupones de GoodRx.  - Si necesita que su receta se enve electrnicamente a una farmacia diferente, informe a nuestra oficina a travs de MyChart de [place] o por telfono llamando al [phone] y presione la opcin 4.

## 2023-04-15 NOTE — Progress Notes (Signed)
 New Patient Visit   Subjective  Molly Murillo is a 53 y.o. female who presents for the following: Face itching, burning, yrs, no treatment, acne face- cysts leaving deep scars, 3-4 yrs, pt not on any birth control now due to superficial cyst on brain, pt is perimenopausal, using otc Dove facial wash, check itchy bumps R lower leg  New patient referral from Gilmore Laroche, FNP.  The following portions of the chart were reviewed this encounter and updated as appropriate: medications, allergies, medical history  Review of Systems:  No other skin or systemic complaints except as noted in HPI or Assessment and Plan.  Objective  Well appearing patient in no apparent distress; mood and affect are within normal limits.   A focused examination was performed of the following areas: Face, right leg  Relevant exam findings are noted in the Assessment and Plan.    Assessment & Plan   ACNE VULGARIS, Cystic with Scarring face Exam: cysts on cheeks, multiple deep pitted scars cheeks, forehead  Chronic and persistent condition with duration or expected duration over one year. Condition is bothersome/symptomatic for patient. Currently flared.   Treatment Plan: Scarring cheeks is difficult to treat, recommend getting acne cleared first before addressing. Discussed Isotretinoin course, discussed pt PCP can check to see if she is post menopausal or if not, if she can go on a form a birth control. While waiting on pt to discuss BC with PCP will start oral antibiotics  Start Doxycycline 100mg  1 po bid with food and drink, when flare resolves decrease to 1 po qd Start Duac gel qd to face  Doxycycline should be taken with food to prevent nausea. Do not lay down for 30 minutes after taking. Be cautious with sun exposure and use good sun protection while on this medication. Pregnant women should not take this medication.   Benzoyl peroxide can cause dryness and irritation of the skin. It can also  bleach fabric. When used together with Aczone (dapsone) cream, it can stain the skin orange.    Isotretinoin Counseling; Review and Contraception Counseling: Reviewed potential side effects of isotretinoin including xerosis, cheilitis, hepatitis, hyperlipidemia, and severe birth defects if taken by a pregnant woman.  Women on isotretinoin must be celibate (not having sex) or required to use at least 2 birth control methods to prevent pregnancy (unless patient is a female of non-child bearing potential).  Females of child-bearing potential must have monthly pregnancy tests while on isotretinoin and report through I-Pledge (FDA monitoring program). Reviewed reports of suicidal ideation in those with a history of depression while taking isotretinoin and reports of diagnosis of inflammatory bowl disease (IBD) while taking isotretinoin as well as the lack of evidence for a causal relationship between isotretinoin, depression and IBD. Patient advised to reach out with any questions or concerns. Patient advised not to share pills or donate blood while on treatment or for one month after completing treatment. All patient's considering Isotretinoin must read and understand and sign Isotretinoin Consent Form and be registered with I-Pledge.   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 Condition is symptomatic/ bothersome to patient. Not currently at goal.   Seborrheic Dermatitis is a chronic persistent rash characterized by pinkness and scaling most commonly of the mid face but also can occur on the scalp (dandruff), ears; mid chest, mid back and groin.  It tends  to be exacerbated by stress and cooler weather.  People who have neurologic disease may experience new onset or exacerbation of existing seborrheic dermatitis.  The condition is  not curable but treatable and can be controlled.  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 Condition is bothersome/symptomatic for patient. Currently flared.   Lichen simplex chronicus [facility]) is a persistent itchy area of thickened skin that is induced by chronic rubbing and/or scratching (chronic dermatitis).  These areas may be pink, hyperpigmented and may have excoriations and bumps (prurigo nodules-PN).  PN/LSC is commonly observed in uncontrolled atopic dermatitis and other forms of eczema, and in other itchy skin conditions (eg, insect bites, scabies).  Sometimes it is not possible to know initial cause of PN/LSC if it has been present for a long time.  It generally responds well to treatment with high potency topical steroids.  It is important to stop rubbing/scratching the area in order to break the itch-scratch-rash-itch cycle, in order for the rash to resolve.   Treatment Plan: Start Clobetasol oint bid and cover with bandage Avoid picking/rubbing/scratching   Topical steroids (such as triamcinolone, fluocinolone, fluocinonide, mometasone, clobetasol, halobetasol, betamethasone, hydrocortisone) can cause thinning and lightening of the skin if they are used for too long in the same area. Your physician has selected the right strength medicine for your problem and area affected on the body. Please use your medication only as directed by your physician to prevent side effects.     Return in about 3 months (around 07/13/2023) for Acne, Seb-Derm, Prurigo Nodules f/u.  I, Ardis Rowan, RMA, am acting as scribe for Willeen Niece, MD .   Documentation: I have reviewed the above documentation for accuracy and completeness, and  I agree with the above.  Willeen Niece, MD

## 2023-05-06 ENCOUNTER — Ambulatory Visit: Payer: Medicaid Other | Admitting: Family Medicine

## 2023-05-06 VITALS — BP 122/79 | HR 96 | Ht 66.0 in | Wt 161.1 lb

## 2023-05-06 DIAGNOSIS — M7918 Myalgia, other site: Secondary | ICD-10-CM | POA: Diagnosis not present

## 2023-05-06 DIAGNOSIS — E038 Other specified hypothyroidism: Secondary | ICD-10-CM

## 2023-05-06 DIAGNOSIS — R7301 Impaired fasting glucose: Secondary | ICD-10-CM | POA: Diagnosis not present

## 2023-05-06 DIAGNOSIS — E7849 Other hyperlipidemia: Secondary | ICD-10-CM

## 2023-05-06 DIAGNOSIS — Z23 Encounter for immunization: Secondary | ICD-10-CM

## 2023-05-06 DIAGNOSIS — E559 Vitamin D deficiency, unspecified: Secondary | ICD-10-CM

## 2023-05-06 DIAGNOSIS — G629 Polyneuropathy, unspecified: Secondary | ICD-10-CM

## 2023-05-06 MED ORDER — NAPROXEN 500 MG PO TABS: 500.0000 mg | ORAL_TABLET | Freq: Two times a day (BID) | ORAL | 1 refills | Status: DC

## 2023-05-06 MED ORDER — GABAPENTIN 300 MG PO CAPS: 300.0000 mg | ORAL_CAPSULE | Freq: Three times a day (TID) | ORAL | 3 refills | Status: DC

## 2023-05-06 NOTE — Patient Instructions (Signed)

## 2023-05-06 NOTE — Progress Notes (Unsigned)
 Established Patient Office Visit  Subjective:  Patient ID: Molly Murillo, female    DOB: 10/01/1970  Age: 53 y.o. MRN: [id number]  CC:  Chief Complaint  Patient presents with   Follow-up    4 Month Musculoskeletal pain  Diagnosed last month w/ carpal tunnel syndrome. Neurology suggested that her PCP prescribe a higher gabapentin might help.  Would like to discuss pain med options or alternatives.     HPI Molly Murillo is a 53 y.o. female with past medical history of Musculoskeletal pain, neuropathy, seizure disorder presents for f/u of  chronic medical conditions.  For the details of today's visit, please refer to the assessment and plan.     Past Medical History:  Diagnosis Date   Narcotic overdose (HCC)    Seizures (HCC)    Syncope     Past Surgical History:  Procedure Laterality Date   LEEP      Family History  Problem Relation Age of Onset   Diabetes Mother     Social History   Socioeconomic History   Marital status: Legally Separated    Spouse name: Not on file   Number of children: Not on file   Years of education: Not on file   Highest education level: Some college, no degree  Occupational History   Not on file  Tobacco Use   Smoking status: Every Day    Current packs/day: 0.03    Types: Cigarettes   Smokeless tobacco: Never  Vaping Use   Vaping status: Former  Substance and Sexual Activity   Alcohol use: Yes    Comment: 1-2 beers per day   Drug use: No   Sexual activity: Yes    Birth control/protection: Condom  Other Topics Concern   Not on file  Social History Narrative   Not on file   Social Drivers of Health   Financial Resource Strain: Low Risk  (05/06/2023)   Overall Financial Resource Strain (CARDIA)    Difficulty of Paying Living Expenses: Not very hard  Food Insecurity: Food Insecurity Present (05/06/2023)   Hunger Vital Sign    Worried About Running Out of Food in the Last Year: Never true    Ran Out of Food in the Last Year:  Sometimes true  Transportation Needs: No Transportation Needs (05/06/2023)   PRAPARE - Administrator, Civil Service (Medical): No    Lack of Transportation (Non-Medical): No  Physical Activity: Unknown (05/06/2023)   Exercise Vital Sign    Days of Exercise per Week: 3 days    Minutes of Exercise per Session: Patient declined  Recent Concern: Physical Activity - Insufficiently Active (04/23/2023)   Received [facility]   Exercise Vital Sign    Days of Exercise per Week: 4 days    Minutes of Exercise per Session: 10 min  Stress: Stress Concern Present (05/06/2023)   [employer] of Occupational Health - Occupational Stress Questionnaire    Feeling of Stress : Rather much  Social Connections: Moderately Isolated (05/06/2023)   Social Connection and Isolation Panel [NHANES]    Frequency of Communication with Friends and Family: Twice a week    Frequency of Social Gatherings with Friends and Family: Once a week    Attends Religious Services: 1 to 4 times per year    Active Member of [employer] or Organizations: No    Attends Engineer, structural: Not on file    Marital Status: Divorced  Intimate Partner Violence: Not At Risk (  04/23/2023)   Received from [facility]   HITS    Over the last 12 months how often did your partner physically hurt you?: Never    Over the last 12 months how often did your partner insult you or talk down to you?: Rarely    Over the last 12 months how often did your partner threaten you with physical harm?: Never    Over the last 12 months how often did your partner scream or curse at you?: Never    Outpatient Medications Prior to Visit  Medication Sig Dispense Refill   ciprofloxacin (CILOXAN) 0.3 % ophthalmic solution Administer 1 drop every 4 hours  while awake for the next 5 days. 5 mL 0   Clindamycin-Benzoyl Per, Refr, gel Apply 1 Application topically daily. qd to face for acne 45 g 2   clobetasol ointment (TEMOVATE) 0.05 % Apply 1  Application topically 2 (two) times daily. Bid to right lower leg and cover with bandage until clear, then prn flares, avoid face, groin, axilla 45 g 1   cyclobenzaprine (FLEXERIL) 5 MG tablet Take 1 tablet by mouth three times daily as needed for muscle spasm 30 tablet 0   doxycycline (MONODOX) 100 MG capsule Take 1 capsule (100 mg total) by mouth 2 (two) times daily. 1 po bid with food and drink 60 capsule 2   folic acid (FOLVITE) 1 MG tablet Take 1 tablet by mouth once daily 30 tablet 0   levETIRAcetam (KEPPRA) 750 MG tablet Take 1 tablet (750 mg total) by mouth 2 (two) times daily. 180 tablet 4   levothyroxine (SYNTHROID) 25 MCG tablet Take 1 tablet (25 mcg total) by mouth daily. 90 tablet 3   magic mouthwash (lidocaine, diphenhydrAMINE, alum & mag hydroxide) suspension Swish and spit 5 mLs 3 (three) times daily as needed for mouth pain. 360 mL 0   mupirocin ointment (BACTROBAN) 2 % Apply 1 Application topically 2 (two) times daily. 30 g 0   omega-3 acid ethyl esters (LOVAZA) 1 g capsule Take 1 capsule (1 g total) by mouth 2 (two) times daily. 90 capsule 2   omeprazole (PRILOSEC) 40 MG capsule Take 1 capsule by mouth once daily 30 capsule 0   pimecrolimus (ELIDEL) 1 % cream Apply topically as directed. qd to bid to itchy burning, tingling rash on face until clear then prn flares 60 g 3   thiamine (VITAMIN B1) 100 MG tablet Take 1 tablet (100 mg total) by mouth daily. 90 tablet 1   tiZANidine (ZANAFLEX) 4 MG tablet Take 1 tablet (4 mg total) by mouth at bedtime. 30 tablet 0   gabapentin (NEURONTIN) 100 MG capsule Take 1 capsule by mouth twice daily 180 capsule 1   naproxen (NAPROSYN) 375 MG tablet Take 1 tablet (375 mg total) by mouth 2 (two) times daily as needed. 60 tablet 3   No facility-administered medications prior to visit.    No Known Allergies  ROS Review of Systems  Constitutional:  Negative for chills and fever.  Eyes:  Negative for visual disturbance.  Respiratory:  Negative  for chest tightness and shortness of breath.   Neurological:  Negative for dizziness and headaches.      Objective:    Physical Exam HENT:     Head: Normocephalic.     Mouth/Throat:     Mouth: Mucous membranes are moist.  Cardiovascular:     Rate and Rhythm: Normal rate.     Heart sounds: Normal heart sounds.  Pulmonary:  Effort: Pulmonary effort is normal.     Breath sounds: Normal breath sounds.  Neurological:     Mental Status: She is alert.     BP 122/79   Pulse 96   Ht 5\' 6"  (1.676 m)   Wt 161 lb 1.9 oz (73.1 kg)   SpO2 98%   BMI 26.01 kg/m  Wt Readings from Last 3 Encounters:  05/06/23 161 lb 1.9 oz (73.1 kg)  01/01/23 164 lb (74.4 kg)  07/30/22 159 lb 1.9 oz (72.2 kg)    Lab Results  Component Value Date   TSH 0.558 04/27/2022   Lab Results  Component Value Date   WBC 5.1 04/27/2022   HGB 11.5 04/27/2022   HCT 34.6 04/27/2022   MCV 97 04/27/2022   PLT 275 04/27/2022   Lab Results  Component Value Date   NA 138 04/27/2022   K 5.0 04/27/2022   CO2 20 04/27/2022   GLUCOSE 81 04/27/2022   BUN 8 04/27/2022   CREATININE 0.78 04/27/2022   BILITOT <0.2 04/27/2022   ALKPHOS 99 04/27/2022   AST 26 04/27/2022   ALT 13 04/27/2022   PROT 6.1 04/27/2022   ALBUMIN 3.8 (L) 04/27/2022   CALCIUM 8.8 04/27/2022   ANIONGAP 12 05/05/2021   EGFR 93 04/27/2022   Lab Results  Component Value Date   CHOL 217 (H) 04/27/2022   Lab Results  Component Value Date   HDL 63 04/27/2022   Lab Results  Component Value Date   LDLCALC 132 (H) 04/27/2022   Lab Results  Component Value Date   TRIG 123 04/27/2022   Lab Results  Component Value Date   CHOLHDL 3.4 04/27/2022   Lab Results  Component Value Date   HGBA1C 5.4 04/27/2022      Assessment & Plan:  Musculoskeletal pain Assessment & Plan: Pain not relieved with current treatment regimen. Encouraged to start taking naproxen 500 mg BID. Nonpharmacological interventions were discussed, and  understanding was verbalized.     Orders: -     Naproxen; Take 1 tablet (500 mg total) by mouth 2 (two) times daily with a meal.  Dispense: 60 tablet; Refill: 1  Neuropathy Assessment & Plan: Will increase gabapentin to 300 mg three times daily, with daily evaluation to monitor effects and potential side effects. Alternatives, risks, benefits, and potential side effects of treatment were discussed      Orders: -     Gabapentin; Take 1 capsule (300 mg total) by mouth 3 (three) times daily.  Dispense: 90 capsule; Refill: 3  Encounter for immunization Assessment & Plan: Patient educated on CDC recommendation for the vaccine. Verbal consent was obtained from the patient, vaccine administered by nurse, no sign of adverse reactions noted at this time. Patient education on arm soreness and use of tylenol or ibuprofen for this patient  was discussed. Patient educated on the signs and symptoms of adverse effect and advise to contact the office if they occur.   Orders: -     Pneumococcal conjugate vaccine 20-valent -     Varicella-zoster vaccine IM  IFG (impaired fasting glucose) -     Hemoglobin A1c  Vitamin D deficiency -     VITAMIN D 25 Hydroxy (Vit-D Deficiency, Fractures)  TSH (thyroid-stimulating hormone deficiency) -     TSH + free T4  Other hyperlipidemia -     Lipid panel -     CMP14+EGFR -     CBC with Differential/Platelet  Note: This chart has been completed using  Chief Executive Officer, and while attempts have been made to ensure accuracy, certain words and phrases may not be transcribed as intended.    Follow-up: Return in about 4 months (around 09/05/2023).   Gilmore Laroche, FNP

## 2023-05-09 DIAGNOSIS — G629 Polyneuropathy, unspecified: Secondary | ICD-10-CM | POA: Insufficient documentation

## 2023-05-09 NOTE — Assessment & Plan Note (Signed)
 Patient educated on CDC recommendation for the vaccine. Verbal consent was obtained from the patient, vaccine administered by nurse, no sign of adverse reactions noted at this time. Patient education on arm soreness and use of tylenol or ibuprofen for this patient  was discussed. Patient educated on the signs and symptoms of adverse effect and advise to contact the office if they occur.

## 2023-05-09 NOTE — Assessment & Plan Note (Signed)
 Will increase gabapentin to 300 mg three times daily, with daily evaluation to monitor effects and potential side effects. Alternatives, risks, benefits, and potential side effects of treatment were discussed

## 2023-05-09 NOTE — Assessment & Plan Note (Signed)
 Pain not relieved with current treatment regimen. Encouraged to start taking naproxen 500 mg BID. Nonpharmacological interventions were discussed, and understanding was verbalized.

## 2023-07-02 ENCOUNTER — Other Ambulatory Visit: Payer: Self-pay | Admitting: Family Medicine

## 2023-07-02 DIAGNOSIS — K219 Gastro-esophageal reflux disease without esophagitis: Secondary | ICD-10-CM

## 2023-07-02 DIAGNOSIS — E538 Deficiency of other specified B group vitamins: Secondary | ICD-10-CM

## 2023-07-03 MED ORDER — OMEPRAZOLE 40 MG PO CPDR: 40.0000 mg | DELAYED_RELEASE_CAPSULE | Freq: Every day | ORAL | 0 refills | Status: DC

## 2023-07-03 MED ORDER — FOLIC ACID 1 MG PO TABS: 1.0000 mg | ORAL_TABLET | Freq: Every day | ORAL | 0 refills | Status: DC

## 2023-07-07 ENCOUNTER — Other Ambulatory Visit: Payer: Self-pay | Admitting: Family Medicine

## 2023-07-07 DIAGNOSIS — M7918 Myalgia, other site: Secondary | ICD-10-CM

## 2023-07-09 ENCOUNTER — Ambulatory Visit (INDEPENDENT_AMBULATORY_CARE_PROVIDER_SITE_OTHER): Payer: Medicaid Other | Admitting: Dermatology

## 2023-07-09 DIAGNOSIS — Z792 Long term (current) use of antibiotics: Secondary | ICD-10-CM | POA: Diagnosis not present

## 2023-07-09 DIAGNOSIS — L281 Prurigo nodularis: Secondary | ICD-10-CM

## 2023-07-09 DIAGNOSIS — Z79899 Other long term (current) drug therapy: Secondary | ICD-10-CM

## 2023-07-09 DIAGNOSIS — L219 Seborrheic dermatitis, unspecified: Secondary | ICD-10-CM | POA: Diagnosis not present

## 2023-07-09 DIAGNOSIS — L700 Acne vulgaris: Secondary | ICD-10-CM | POA: Diagnosis not present

## 2023-07-09 DIAGNOSIS — Z7189 Other specified counseling: Secondary | ICD-10-CM

## 2023-07-09 DIAGNOSIS — B079 Viral wart, unspecified: Secondary | ICD-10-CM

## 2023-07-09 MED ORDER — CLINDAMYCIN PHOS-BENZOYL PEROX 1.2-5 % EX GEL: 1.0000 | Freq: Every day | CUTANEOUS | 2 refills | Status: DC

## 2023-07-09 MED ORDER — PIMECROLIMUS 1 % EX CREA: TOPICAL_CREAM | CUTANEOUS | 3 refills | Status: DC

## 2023-07-09 NOTE — Progress Notes (Signed)
 Follow-Up Visit   Subjective  Molly Murillo is a 53 y.o. female who presents for the following: Acne, Seb Derm, and Prurigo nodules f/u.   Acne- Doxycycline  100mg  daily and benzoyl peroxide. Patient denies any side effects, does not take it consistently and sometimes does. Patient wanting to know if interaction since taking Keflex . Using duac gel in the mornings. Patient flares takes doxy twice daily  Seb Derm- using elidel  cream on face and improved some.   Purigo nodules/LSC- using clobetasol  ointment uses prn flares. Is causing lightening of surrounding skin. Patient would like to discuss sending something stronger.   Patient accompanied with son.   The patient has spots, moles and lesions to be evaluated, some may be new or changing and the patient may have concern these could be cancer.   The following portions of the chart were reviewed this encounter and updated as appropriate: medications, allergies, medical history  Review of Systems:  No other skin or systemic complaints except as noted in HPI or Assessment and Plan.  Objective  Well appearing patient in no apparent distress; mood and affect are within normal limits.  A focused examination was performed of the following areas: Face, leg  Relevant exam findings are noted in the Assessment and Plan.  Right inferior knee 1cm hypopigmented firm scaly papule at R inferior knee. Surrounding hyperpigmented macules and papules     Assessment & Plan   ACNE VULGARIS Exam: Firm subcutaneous cyst at L mid cheek, smaller cysts on R cheek with multiple pitted scars on the cheeks, forehead.    Chronic and persistent condition with duration or expected duration over one year. Condition is bothersome/symptomatic for patient. Currently flared.   Treatment Plan: Recommend finish Keflex  and when done with course then restart Doxycycline  100mg  1 po bid with food and drink, when flare resolves decrease to 1 po qd   Doxycycline   should be taken with food to prevent nausea. Do not lay down for 30 minutes after taking. Be cautious with sun exposure and use good sun protection while on this medication. Pregnant women should not take this medication.   Continue Duac gel qd to face.   Pt interested in Isotretinoin treatment Patient will find out if postmenopausal or not with her PCP. She hasn't had a period for months. If she isn't post-menopausal, she will need two forms of birth control. Patient will send us  MyChart message after she checks with them and let us  know.  Isotretinoin Counseling; Review and Contraception Counseling: Reviewed potential side effects of isotretinoin including xerosis, cheilitis, hepatitis, hyperlipidemia, and severe birth defects if taken by a pregnant woman.  Women on isotretinoin must be celibate (not having sex) or required to use at least 2 birth control methods to prevent pregnancy (unless patient is a female of non-child bearing potential).  Females of child-bearing potential must have monthly pregnancy tests while on isotretinoin and report through I-Pledge (FDA monitoring program). Reviewed reports of suicidal ideation in those with a history of depression while taking isotretinoin and reports of diagnosis of inflammatory bowl disease (IBD) while taking isotretinoin as well as the lack of evidence for a causal relationship between isotretinoin, depression and IBD. Patient advised to reach out with any questions or concerns. Patient advised not to share pills or donate blood while on treatment or for one month after completing treatment. All patient's considering Isotretinoin must read and understand and sign Isotretinoin Consent Form and be registered with I-Pledge.   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 Condition is symptomatic/ bothersome to patient. Not currently at  goal.     Seborrheic Dermatitis is a chronic persistent rash characterized by pinkness and scaling most commonly of the mid face but also can occur on the scalp (dandruff), ears; mid chest, mid back and groin.  It tends to be exacerbated by stress and cooler weather.  People who have neurologic disease may experience new onset or exacerbation of existing seborrheic dermatitis.  The condition is not curable but treatable and can be controlled.   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 Condition is bothersome/symptomatic for patient. Currently flared.  Since pt is getting irritated by bandaids causing hyperpigmentation, recommend applying non-stick pad, then wrapping coban over top. If itchy, apply cold pack on it. Avoid picking/rubbing/scratching to prevent repetitive trauma to skin.   D/C Clobetasol  ointment.   May apply elidel  cream 1-2 times daily prn itch to aa, R inferior knee.    Viral Wart (HPV) Counseling  Discussed viral / HPV (Human Papilloma Virus) etiology and risk of spread /infectivity to other areas of body as well as to other people.  Multiple treatments and methods may be required to clear warts and it is possible treatment may not be successful.  Treatment risks include discoloration; scarring and there is still potential for wart recurrence. Destruction of lesion - Right inferior knee  Destruction method: cryotherapy   Informed consent: discussed and consent obtained   Lesion destroyed using liquid nitrogen: Yes   Region frozen until ice ball extended beyond lesion: Yes   Outcome: patient tolerated procedure well with no complications   Post-procedure details: wound care instructions given   Additional details:  Prior to procedure,  discussed risks of blister formation, small wound, skin dyspigmentation, or rare scar following cryotherapy. Recommend Vaseline ointment to treated areas while healing.  ACNE VULGARIS   MEDICATION MANAGEMENT   COUNSELING AND COORDINATION OF CARE   PRURIGO NODULARIS    Return in 1 month (on 08/09/2023) for w/ Dr. Annette Barters for acne, seb derm, wart/pruigo nodule f/u.  I, Jacquelynn Vera, CMA, am acting as scribe for Artemio Larry, MD .   Documentation: I have reviewed the above documentation for accuracy and completeness, and I agree with the above.  Artemio Larry, MD

## 2023-07-09 NOTE — Patient Instructions (Addendum)
 Find out about postmenopausal or if not with primary care doctor, birth control options prior to starting Accutane for cystic acne.  Discontinue using clobetasol  ointment.    Cryotherapy Aftercare  Wash gently with soap and water everyday.   Apply Vaseline and Band-Aid daily until healed.     Due to recent changes in healthcare laws, you may see results of your pathology and/or laboratory studies on MyChart before the doctors have had a chance to review them. We understand that in some cases there may be results that are confusing or concerning to you. Please understand that not all results are received at the same time and often the doctors may need to interpret multiple results in order to provide you with the best plan of care or course of treatment. Therefore, we ask that you please give us  2 business days to thoroughly review all your results before contacting the office for clarification. Should we see a critical lab result, you will be contacted sooner.   If You Need Anything After Your Visit  If you have any questions or concerns for your doctor, please call our main line at [phone] and press option 4 to reach your doctor's medical assistant. If no one answers, please leave a voicemail as directed and we will return your call as soon as possible. Messages left after 4 pm will be answered the following business day.   You may also send us  a message via MyChart. We typically respond to MyChart messages within 1-2 business days.  For prescription refills, please ask your pharmacy to contact our office. Our fax number is [phone].  If you have an urgent issue when the clinic is closed that cannot wait until the next business day, you can page your doctor at the number below.    Please note that while we do our best to be available for urgent issues outside of office hours, we are not available 24/7.   If you have an urgent issue and are unable to reach us , you may choose to  seek medical care at your doctor's office, retail clinic, urgent care center, or emergency room.  If you have a medical emergency, please immediately call 911 or go to the emergency department.  Pager Numbers  - Dr. Bary Likes: [phone]  - Dr. Annette Barters: [phone]  - Dr. Felipe Horton: [phone]   In the event of inclement weather, please call our main line at [phone] for an update on the status of any delays or closures.  Dermatology Medication Tips: Please keep the boxes that topical medications come in in order to help keep track of the instructions about where and how to use these. Pharmacies typically print the medication instructions only on the boxes and not directly on the medication tubes.   If your medication is too expensive, please contact our office at [phone] option 4 or send us  a message through MyChart.   We are unable to tell what your co-pay for medications will be in advance as this is different depending on your insurance coverage. However, we may be El[place] to find a substitute medication at lower cost or fill out paperwork to get insurance to cover a needed medication.   If a prior authorization is required to get your medication covered by your insurance company, please allow us  1-2 business days to complete this process.  Drug prices often vary depending on where the prescription is filled and some pharmacies may offer cheaper prices.  The website www.goodrx.com contains coupons for medications  through different pharmacies. The prices here do not account for what the cost may be with help from insurance (it may be cheaper with your insurance), but the website can give you the price if you did not use any insurance.  - You can print the associated coupon and take it with your prescription to the pharmacy.  - You may also stop by our office during regular business hours and pick up a GoodRx coupon card.  - If you need your prescription sent electronically to a  different pharmacy, notify our office through [facility] or by phone at [phone] option 4.     Si Usted Necesita Algo Despus de Su Visita  Tambin puede enviarnos un mensaje a travs de Clinical cytogeneticist. Por lo general respondemos a los mensajes de MyChart en el transcurso de 1 a 2 das hbiles.  Para renovar recetas, por favor pida a su farmacia que se ponga en contacto con nuestra oficina. Franz Jacks de fax es [place] [phone].  Si tiene un asunto urgente cuando la clnica est cerrada y que no puede esperar hasta el siguiente da hbil, puede llamar/localizar a su doctor(a) al nmero que aparece a continuacin.   Por favor, tenga en cuenta que aunque hacemos todo lo posible para estar disponibles para asuntos urgentes fuera del horario de [place], no estamos disponibles las 24 horas del da, los 7 [address]  [address] de la [place].   Si tiene un problema urgente y no puede comunicarse con nosotros, puede optar por buscar atencin mdica  en el consultorio de su doctor(a), en una clnica privada, en un centro de atencin urgente o en una sala de emergencias.  Si tiene Engineer, drilling, por favor llame inmediatamente al 911 o vaya a la sala de emergencias.  Nmeros de bper  - Dr. Bary Likes: [phone]  - Dra. Annette Barters: [phone]  - Dr. Felipe Horton: [phone]   En caso de inclemencias del tiempo, por favor llame a Lajuan Pila principal al [phone] para una actualizacin sobre el [place] de cualquier retraso o cierre.  Consejos para la medicacin en dermatologa: Por favor, guarde las cajas en las que vienen los medicamentos de uso tpico para ayudarle a seguir las instrucciones sobre dnde y cmo usarlos. Las farmacias generalmente imprimen las instrucciones del medicamento slo en las cajas y no directamente en los tubos del [place].   Si su medicamento es muy caro, por favor, pngase en contacto con Bettyjane Brunet llamando al [phone] y presione la opcin 4 o envenos un  mensaje a travs de Clinical cytogeneticist.   No podemos decirle cul ser su copago por los medicamentos por adelantado ya que esto es diferente dependiendo de la cobertura de su seguro. Sin embargo, es posible que podamos encontrar un medicamento sustituto a Audiological scientist un formulario para que el seguro cubra el medicamento que se considera necesario.   Si se requiere una autorizacin previa para que su compaa de seguros [place] su medicamento, por favor permtanos de 1 a 2 das hbiles para completar este proceso.  Los precios de los medicamentos varan con frecuencia dependiendo del Environmental consultant de dnde se surte la receta y alguna farmacias pueden ofrecer precios ms baratos.  El sitio web www.goodrx.com tiene cupones para medicamentos de Health and safety inspector. Los precios aqu no tienen en cuenta lo que podra costar con la ayuda del seguro (puede ser ms barato con su seguro), pero el sitio web puede darle el precio si no utiliz Tourist information centre manager.  - Puede imprimir el cupn correspondiente y  llevarlo con su receta a la farmacia.  - Tambin puede pasar por nuestra oficina durante el horario de atencin regular y Education officer, museum una tarjeta de cupones de GoodRx.  - Si necesita que su receta se enve electrnicamente a una farmacia diferente, informe a nuestra oficina a travs de MyChart de [place] o por telfono llamando al [phone] y presione la opcin 4.

## 2023-08-06 ENCOUNTER — Ambulatory Visit: Admitting: Dermatology

## 2023-09-06 ENCOUNTER — Ambulatory Visit: Admitting: Family Medicine

## 2023-09-11 ENCOUNTER — Encounter: Payer: Self-pay | Admitting: Family Medicine

## 2023-09-25 ENCOUNTER — Ambulatory Visit: Admitting: Dermatology

## 2023-09-25 DIAGNOSIS — Z3009 Encounter for other general counseling and advice on contraception: Secondary | ICD-10-CM

## 2023-09-25 DIAGNOSIS — L219 Seborrheic dermatitis, unspecified: Secondary | ICD-10-CM

## 2023-09-25 DIAGNOSIS — Z79899 Other long term (current) drug therapy: Secondary | ICD-10-CM

## 2023-09-25 DIAGNOSIS — L281 Prurigo nodularis: Secondary | ICD-10-CM

## 2023-09-25 DIAGNOSIS — Z7189 Other specified counseling: Secondary | ICD-10-CM

## 2023-09-25 DIAGNOSIS — L700 Acne vulgaris: Secondary | ICD-10-CM

## 2023-09-25 NOTE — Patient Instructions (Signed)

## 2023-09-25 NOTE — Progress Notes (Signed)
 Follow-Up Visit   Subjective  Molly Murillo is a 53 y.o. female who presents for the following: Acne, Seb Derm, and Prurigo nodules f/u.   Acne- pt states that acne has improved when she remembers to use the topical medication that she keeps in the refrigerator. She is having a change of PCP and hasn't asked them if she is post menopausal. She would like to move forward with Isotretinoin, but doesn't want to have to take another pill for birthcontrol, and she is concerned that the cyst on her brain was caused by Depo Provera.   Purigo nodules/LSC- patient was recently bit by a tick on the lower leg, and a few days after removing it she started to get a hive like reaction. She started taking Doxycycline  and the rash improved, but the bite remains dark in color and her pants and boots rub on area. She has stopped using Clobetasol  cream, and is currently using Elidel  cream on lesions. She would like to know if bites can be treated with LN2 since they are still irritating.  The patient has spots, moles and lesions to be evaluated, some may be new or changing and the patient may have concern these could be cancer.  The following portions of the chart were reviewed this encounter and updated as appropriate: medications, allergies, medical history  Review of Systems:  No other skin or systemic complaints except as noted in HPI or Assessment and Plan.  Objective  Well appearing patient in no apparent distress; mood and affect are within normal limits.  A focused examination was performed of the following areas: Face, leg  Relevant exam findings are noted in the Assessment and Plan.  R upper pretibia x 1, R upper med thigh x 2 (3)  - R lower leg, hyperpigmented firm scaly nodules at the R med thigh and lat lower leg, R upper pretibia with depigmented verrucous small plaque   Assessment & Plan   ACNE VULGARIS Exam: Multiple cysts with pitted scars on the cheeks.  Chronic and persistent  condition with duration or expected duration over one year. Condition is bothersome/symptomatic for patient. Currently flared.  Treatment Plan:   Continue Doxycycline  100 mg po QD. Doxycycline  should be taken with food to prevent nausea. Do not lay down for 30 minutes after taking. Be cautious with sun exposure and use good sun protection while on this medication. Pregnant women should not take this medication.   Continue Duac gel QD.  Discussed with patient that the only way to remove deep pitted acne scarring is surgical treatment.  Pt interested in Isotretinoin treatment if she doesn't have to do on birth control.. Will order LH and FSH to see if patient is post menopausal. Will schedule follow up appointment based on lab results.  Isotretinoin Counseling; Review and Contraception Counseling: Reviewed potential side effects of isotretinoin including xerosis, cheilitis, hepatitis, hyperlipidemia, and severe birth defects if taken by a pregnant woman.  Women on isotretinoin must be celibate (not having sex) or required to use at least 2 birth control methods to prevent pregnancy (unless patient is a female of non-child bearing potential).  Females of child-bearing potential must have monthly pregnancy tests while on isotretinoin and report through I-Pledge (FDA monitoring program). Reviewed reports of suicidal ideation in those with a history of depression while taking isotretinoin and reports of diagnosis of inflammatory bowl disease (IBD) while taking isotretinoin as well as the lack of evidence for a causal relationship between isotretinoin, depression and IBD.  Patient advised to reach out with any questions or concerns. Patient advised not to share pills or donate blood while on treatment or for one month after completing treatment. All patient's considering Isotretinoin must read and understand and sign Isotretinoin Consent Form and be registered with I-Pledge. LONG-TERM USE OF HIGH-RISK  MEDICATION   Related Procedures Follicle Stimulating Hormone Luteinizing Hormone PRURIGO NODULARIS R upper pretibia x 1, R upper med thigh x 2 (3) Tick bite reaction with prurigo nodularis (vs wart at R upper pretibia)  Chronic and persistent condition with duration or expected duration over one year. Condition is bothersome/symptomatic for patient. Currently flared.   Lichen simplex chronicus [facility]) is a persistent itchy area of thickened skin that is induced by chronic rubbing and/or scratching (chronic dermatitis).  These areas may be pink, hyperpigmented and may have excoriations and bumps (prurigo nodules-PN).  PN/LSC is commonly observed in uncontrolled atopic dermatitis and other forms of eczema, and in other itchy skin conditions (eg, insect bites, scabies).  Sometimes it is not possible to know initial cause of PN/LSC if it has been present for a long time.  It generally responds well to treatment with high potency topical steroids.  It is important to stop rubbing/scratching the area in order to break the itch-scratch-rash-itch cycle, in order for the rash to resolve  Apply Clobetasol  to aa's tick bites QD-BID PRN itch.   Topical steroids (such as triamcinolone, fluocinolone, fluocinonide, mometasone, clobetasol , halobetasol, betamethasone, hydrocortisone) can cause thinning and lightening of the skin if they are used for too long in the same area. Your physician has selected the right strength medicine for your problem and area affected on the body. Please use your medication only as directed by your physician to prevent side effects.   Avoid picking/rubbing/scratching Destruction of lesion - R upper pretibia x 1, R upper med thigh x 2 (3)  Destruction method: cryotherapy   Informed consent: discussed and consent obtained   Lesion destroyed using liquid nitrogen: Yes   Region frozen until ice ball extended beyond lesion: Yes   Outcome: patient tolerated procedure well with no  complications   Post-procedure details: wound care instructions given   Additional details:  Prior to procedure, discussed risks of blister formation, small wound, skin dyspigmentation, or rare scar following cryotherapy. Recommend Vaseline ointment to treated areas while healing.   ACNE VULGARIS   MEDICATION MANAGEMENT   SEBORRHEIC DERMATITIS    SEBORRHEIC DERMATITIS Exam: Face clear today  Chronic condition with duration or expected duration over one year. Currently well-controlled.  Seborrheic Dermatitis is a chronic persistent rash characterized by pinkness and scaling most commonly of the mid face but also can occur on the scalp (dandruff), ears; mid chest, mid back and groin.  It tends to be exacerbated by stress and cooler weather.  People who have neurologic disease may experience new onset or exacerbation of existing seborrheic dermatitis.  The condition is not curable but treatable and can be controlled.  Treatment Plan: Continue Elidel  cream to aa's QD PRN.   Return for follow up pending lab results.  LILLETTE Rosina Mayans, CMA, am acting as scribe for Rexene Rattler, MD .  Documentation: I have reviewed the above documentation for accuracy and completeness, and I agree with the above.  Rexene Rattler, MD

## 2023-10-14 ENCOUNTER — Telehealth: Payer: Self-pay

## 2023-10-14 NOTE — Telephone Encounter (Signed)
 Patient sent a MyChart appt request message but wanted to document here so you were aware:  I didn't have the blood work done due to the fact my cycle came on the same day. I'm going to have to decide to types of birth control so I can take the medication for my skin. Thanks in advance.

## 2023-10-25 ENCOUNTER — Other Ambulatory Visit: Payer: Self-pay | Admitting: Dermatology

## 2023-10-25 ENCOUNTER — Other Ambulatory Visit: Payer: Self-pay | Admitting: Family Medicine

## 2023-10-25 DIAGNOSIS — K219 Gastro-esophageal reflux disease without esophagitis: Secondary | ICD-10-CM

## 2023-10-25 DIAGNOSIS — E038 Other specified hypothyroidism: Secondary | ICD-10-CM

## 2023-10-25 DIAGNOSIS — M7918 Myalgia, other site: Secondary | ICD-10-CM

## 2023-10-28 ENCOUNTER — Other Ambulatory Visit: Payer: Self-pay | Admitting: Family Medicine

## 2023-10-28 DIAGNOSIS — M7918 Myalgia, other site: Secondary | ICD-10-CM

## 2023-10-29 ENCOUNTER — Other Ambulatory Visit: Payer: Self-pay | Admitting: Family Medicine

## 2023-10-29 DIAGNOSIS — G629 Polyneuropathy, unspecified: Secondary | ICD-10-CM

## 2023-10-29 NOTE — Telephone Encounter (Unsigned)
 Copied from CRM [id number]. Topic: Clinical - Medication Refill >> Oct 29, 2023  3:24 PM Nathanel BROCKS wrote: Medication: gabapentin  (NEURONTIN ) 300 MG capsule  Iron 325 mg  Has the patient contacted their pharmacy? Yes   This is the patient's preferred pharmacy:   [facility], [place] - [address] [place] [address] Phone: [phone] Fax: [phone]  Is this the correct pharmacy for this prescription? Yes If no, delete pharmacy and type the correct one.   Has the prescription been filled recently? Yes  Is the patient out of the medication? Yes  Has the patient been seen for an appointment in the last year OR does the patient have an upcoming appointment? Yes  Can we respond through MyChart? No  Agent: Please be advised that Rx refills may take up to 3 business days. We ask that you follow-up with your pharmacy.

## 2023-10-30 MED ORDER — GABAPENTIN 300 MG PO CAPS: 300.0000 mg | ORAL_CAPSULE | Freq: Three times a day (TID) | ORAL | 0 refills | Status: DC

## 2023-11-05 ENCOUNTER — Ambulatory Visit (INDEPENDENT_AMBULATORY_CARE_PROVIDER_SITE_OTHER)

## 2023-11-05 ENCOUNTER — Encounter (HOSPITAL_COMMUNITY): Payer: Self-pay

## 2023-11-05 ENCOUNTER — Ambulatory Visit (HOSPITAL_COMMUNITY)
Admission: EM | Admit: 2023-11-05 | Discharge: 2023-11-05 | Disposition: A | Discharge status: Home / Self Care | Attending: Emergency Medicine | Admitting: Emergency Medicine

## 2023-11-05 DIAGNOSIS — M25511 Pain in right shoulder: Secondary | ICD-10-CM

## 2023-11-05 DIAGNOSIS — W19XXXA Unspecified fall, initial encounter: Secondary | ICD-10-CM | POA: Diagnosis not present

## 2023-11-05 DIAGNOSIS — M898X1 Other specified disorders of bone, shoulder: Secondary | ICD-10-CM | POA: Diagnosis not present

## 2023-11-05 NOTE — ED Triage Notes (Signed)
 Pt states she fell yesterday C/O pain to right shoulder and collar bone. States she took a Goody powder at home for the pain.

## 2023-11-05 NOTE — Discharge Instructions (Signed)
 Your xrays are negative. Please follow up with your Orthopedist and neurologist Goody powders have tylenol , aspirin and caffeine  May apply topical biofreeze,lidocaine  gel to area or use  ice'heat to area for pain 20 min 3 x daily Avoid taking naproxen  aleve  or ibuprofen  or tylenol  while taking goody's powder.

## 2023-11-05 NOTE — ED Provider Notes (Signed)
 [facility]    CSN: [id number] Arrival date & time: 11/05/23  1146      History   Chief Complaint Chief Complaint  Patient presents with   Fall    HPI PINKI ROTTMAN is a 53 y.o. female.   53 year old female, Emera Bussie, presents to urgent care for evaluation of fall that occurred yesterday. Pt states she tripped and landed on right shoulder, c/o right shoulder and collar bone pain. Pt took a goody powder at home for the pain.   The history is provided by the patient. No language interpreter was used.    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   Impetigo 01/01/2023   Musculoskeletal pain 01/01/2023   Encounter for immunization 01/01/2023   Tension headache 07/30/2022   Bilateral swelling of feet and ankles 07/30/2022   Anxiety and depression 04/27/2022   Acute vaginitis 04/27/2022   Lumbar pain 01/25/2022   Need for immunization against influenza 01/25/2022   Uterine prolapse 09/25/2021   Bartholin cyst 09/25/2021   Hypothyroidism 09/25/2021   Cervical cancer screening 09/25/2021   Bacterial conjunctivitis of left eye 06/24/2021   Vitamin B1 deficiency 06/24/2021   Seizure disorder (HCC) 08/16/2020   Frequent headaches 12/15/2019   Iron deficiency anemia 12/15/2019   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      Home Medications    Prior to Admission medications   Medication Sig Start Date End Date Taking? Authorizing  Provider  ciprofloxacin  (CILOXAN ) 0.3 % ophthalmic solution Administer 1 drop every 4 hours  while awake for the next 5 days. 04/27/22   Zarwolo, Gloria, FNP  Clindamycin -Benzoyl Per, Refr, gel Apply 1 Application topically daily. qd to face for acne 07/09/23   Jackquline Sawyer, MD  clobetasol  ointment (TEMOVATE ) 0.05 % Apply 1 Application topically 2 (two) times daily. Bid to right lower leg and cover with bandage until clear, then prn flares, avoid face, groin, axilla 04/15/23   Jackquline Sawyer, MD  cyclobenzaprine  (FLEXERIL ) 5 MG tablet Take 1 tablet by mouth three times daily as needed for muscle spasm 03/04/23   Zarwolo, Gloria, FNP  doxycycline  (MONODOX ) 100 MG capsule Take 1 capsule (100 mg total) by mouth 2 (two) times daily. 1 po bid with food and drink 04/15/23   Jackquline Sawyer, MD  folic acid  (FOLVITE ) 1 MG tablet Take 1 tablet (1 mg total) by mouth daily. 07/03/23   Zarwolo, Gloria, FNP  gabapentin  (NEURONTIN ) 300 MG capsule Take 1 capsule (300 mg total) by mouth 3 (three) times daily. 10/30/23   Zarwolo, Gloria, FNP  levETIRAcetam  (KEPPRA  XR) 750 MG 24 hr tablet Take 750 mg by mouth at bedtime. 09/18/23   [provider]  levothyroxine  (SYNTHROID ) 25 MCG tablet TAKE 1 TABLET BY MOUTH EVERY DAY *NEW PRESCRIPTION REQUEST* 10/25/23   Zarwolo, Gloria, FNP  magic mouthwash (lidocaine , diphenhydrAMINE , alum & mag hydroxide)  suspension Swish and spit 5 mLs 3 (three) times daily as needed for mouth pain. 05/05/21   Butler, Michael C, MD  mupirocin  ointment (BACTROBAN ) 2 % Apply 1 Application topically 2 (two) times daily. 01/01/23   Zarwolo, Gloria, FNP  naproxen  (NAPROSYN ) 500 MG tablet TAKE ONE (1) TABLET BY MOUTH TWICE DAILY *NEW PRESCRIPTION REQUEST* 10/25/23   Zarwolo, Gloria, FNP  omega-3 acid ethyl esters (LOVAZA ) 1 g capsule Take 1 capsule (1 g total) by mouth 2 (two) times daily. 01/25/22   Zarwolo, Gloria, FNP  omeprazole  (PRILOSEC) 40 MG capsule TAKE ONE (1) CAPSULE BY MOUTH ONCE DAILY *NEW  PRESCRIPTION REQUEST* 10/25/23   Zarwolo, Gloria, FNP  pimecrolimus  (ELIDEL ) 1 % cream APPLY TOPICALLY AS DIRECTED ONCE TO TWICE DAILY TO ITCHY BURNING, TINGLING RASH ON FACE UNTIL CLEAR THEN AS NEEDED FOR FLARES *NEW PRESCRIPTION REQUEST* 10/28/23   Jackquline Sawyer, MD  thiamine  (VITAMIN B1) 100 MG tablet Take 1 tablet (100 mg total) by mouth daily. 04/27/22   Zarwolo, Gloria, FNP  tiZANidine  (ZANAFLEX ) 4 MG tablet Take 1 tablet (4 mg total) by mouth at bedtime. 07/30/22   Zarwolo, Gloria, FNP    Family History Family History  Problem Relation Age of Onset   Diabetes Mother     Social History Social History   Tobacco Use   Smoking status: Every Day    Current packs/day: 0.03    Types: Cigarettes   Smokeless tobacco: Never  Vaping Use   Vaping status: Former  Substance Use Topics   Alcohol use: Yes    Comment: 1-2 beers per day   Drug use: No     Allergies   Patient has no known allergies.   Review of Systems Review of Systems  Musculoskeletal:  Positive for arthralgias and myalgias. Negative for joint swelling.  Skin: Negative.   All other systems reviewed and are negative.    Physical Exam Triage Vital Signs ED Triage Vitals  Encounter Vitals Group     BP 11/05/23 1208 138/89     Girls Systolic BP Percentile --      Girls Diastolic BP Percentile --      Boys Systolic BP Percentile --      Boys Diastolic BP Percentile --      Pulse Rate 11/05/23 1208 98     Resp 11/05/23 1208 16     Temp 11/05/23 1208 99 F (37.2 C)     Temp Source 11/05/23 1208 Oral     SpO2 11/05/23 1208 96 %     Weight --      Height --      Head Circumference --      Peak Flow --      Pain Score 11/05/23 1209 9     Pain Loc --      Pain Education --      Exclude from Growth Chart --    No data found.  Updated Vital Signs BP 138/89 (BP Location: Left Arm)   Pulse 98   Temp 99 F (37.2 C) (Oral)   Resp 16   SpO2 96%   Visual Acuity Right Eye Distance:   Left Eye Distance:    Bilateral Distance:    Right Eye Near:   Left Eye Near:    Bilateral Near:     Physical Exam Vitals and nursing note reviewed.  Musculoskeletal:     Right shoulder: Bony tenderness present. Decreased range of motion. Normal pulse.       Arms:  Neurological:     General: No focal deficit present.     Mental Status: She is alert and oriented to person, place, and time.     GCS: GCS eye subscore is 4. GCS verbal subscore is 5. GCS motor subscore is 6.  Psychiatric:        Attention and Perception: Attention normal.        Mood and Affect: Mood normal.        Speech: Speech normal.        Behavior: Behavior is cooperative.      UC Treatments / Results  Labs (all labs ordered are listed, but only abnormal results are displayed) Labs Reviewed - No data to display  EKG   Radiology DG Shoulder Right Result Date: 11/05/2023 EXAM: 1 VIEW XRAY OF THE RIGHT SHOULDER 11/05/2023 01:48:11 PM COMPARISON: None available. CLINICAL HISTORY: Fall. Pt states she fell yesterday C/O pain to right shoulder and collar bone. States she took a Goody powder at home for the pain. FINDINGS: BONES AND JOINTS: Glenohumeral joint is normally aligned. No acute fracture or dislocation. The [facility] joint is unremarkable in appearance. SOFT TISSUES: No abnormal calcifications. Visualized lung is unremarkable. IMPRESSION: 1. No acute findings. Electronically signed by: Norman Gatlin MD 11/05/2023 01:57 PM EDT RP Workstation: [id number]   DG Clavicle Right Result Date: 11/05/2023 EXAM: 1 VIEW XRAY OF THE RIGHT CLAVICLE 11/05/2023 01:48:11 PM COMPARISON: None available. CLINICAL HISTORY: Fall. Pt states she fell yesterday C/O pain to right shoulder and collar bone. States she took a Goody powder at home for the pain. FINDINGS: BONES: No acute fracture or focal osseous lesion. JOINTS: No joint dislocation. SOFT TISSUES: The soft tissues are unremarkable. IMPRESSION: 1. No acute osseous abnormality. Electronically signed  by: Donnice Mania MD 11/05/2023 01:57 PM EDT RP Workstation: [id number]    Procedures Procedures (including critical care time)  Medications Ordered in UC Medications - No data to display  Initial Impression / Assessment and Plan / UC Course  I have reviewed the triage vital signs and the nursing notes.  Pertinent labs & imaging results that were available during my care of the patient were reviewed by me and considered in my medical decision making (see chart for details).  Clinical Course as of 11/05/23 1649  Tue Nov 05, 2023  1339 Awaiting xrays, delay d/t computer/imaging maintenance per RT. [JD]  1401 Right shoulder and clavicle are both negative.  [JD]    Clinical Course User Index [JD] Zayan Delvecchio, Rilla, NP  Discussed exam findings and plan of care with patient, xrays are negative,strict go to ER precautions given, referral to pt's Ortho/PCP for follow up.   Patient verbalized understanding to this provider.  Ddx: Right shoulder pain, right clavicle pain, fall, musculoskeletal pain Final Clinical Impressions(s) / UC Diagnoses   Final diagnoses:  Fall, initial encounter  Acute pain of right shoulder  Pain of right clavicle     Discharge Instructions      Your xrays are negative. Please follow up with your Orthopedist and neurologist Goody powders have tylenol , aspirin and caffeine  May apply topical biofreeze,lidocaine  gel to area or use  ice'heat to area for pain 20 min 3 x daily Avoid taking naproxen  aleve  or ibuprofen  or tylenol  while taking goody's powder.      ED Prescriptions   None    PDMP not reviewed this encounter.   Aminta Rilla, NP 11/05/23 1649

## 2023-11-06 ENCOUNTER — Telehealth: Payer: Self-pay | Admitting: Family Medicine

## 2023-11-06 NOTE — Telephone Encounter (Signed)
 Copied from CRM [id number]. Topic: Clinical - Prescription Issue >> Nov 05, 2023  2:26 PM Zebedee SAUNDERS wrote: Reason for CRM: Pt fell yesterday and went to urgent care today but they can't prescribed anything for pain. Please call pt at [phone] [facility] [address] [place] [address] [place] [address] [place] [address] Phone: [phone] Fax: [phone]

## 2023-11-08 NOTE — Telephone Encounter (Signed)
 Called patient scheduled with np chelsa boswell

## 2023-11-13 ENCOUNTER — Other Ambulatory Visit: Payer: Self-pay | Admitting: Family Medicine

## 2023-11-13 DIAGNOSIS — G629 Polyneuropathy, unspecified: Secondary | ICD-10-CM

## 2023-11-14 ENCOUNTER — Other Ambulatory Visit: Payer: Self-pay | Admitting: Family Medicine

## 2023-11-14 DIAGNOSIS — G629 Polyneuropathy, unspecified: Secondary | ICD-10-CM

## 2023-11-26 ENCOUNTER — Ambulatory Visit: Admitting: Nurse Practitioner

## 2023-11-27 ENCOUNTER — Other Ambulatory Visit: Payer: Self-pay | Admitting: Dermatology

## 2023-12-06 ENCOUNTER — Other Ambulatory Visit: Payer: Self-pay | Admitting: Family Medicine

## 2023-12-06 DIAGNOSIS — G629 Polyneuropathy, unspecified: Secondary | ICD-10-CM

## 2023-12-09 ENCOUNTER — Other Ambulatory Visit: Payer: Self-pay

## 2023-12-09 MED ORDER — CLOBETASOL PROPIONATE 0.05 % EX OINT: TOPICAL_OINTMENT | CUTANEOUS | 1 refills | Status: DC

## 2023-12-09 NOTE — Progress Notes (Signed)
Pharmacy requesting prescription

## 2023-12-16 ENCOUNTER — Ambulatory Visit: Admitting: Dermatology

## 2023-12-22 ENCOUNTER — Other Ambulatory Visit: Payer: Self-pay | Admitting: Family Medicine

## 2023-12-22 DIAGNOSIS — M545 Low back pain, unspecified: Secondary | ICD-10-CM

## 2024-01-14 ENCOUNTER — Other Ambulatory Visit: Payer: Self-pay | Admitting: Family Medicine

## 2024-01-14 DIAGNOSIS — H109 Unspecified conjunctivitis: Secondary | ICD-10-CM

## 2024-01-14 NOTE — Telephone Encounter (Signed)
 Copied from CRM #[id number]. Topic: Clinical - Medication Refill >> Jan 14, 2024  4:06 PM Winona R wrote: Medication:  ciprofloxacin  (CILOXAN ) 0.3 % ophthalmic solution    Has the patient contacted their pharmacy? Yes (Agent: If no, request that the patient contact the pharmacy for the refill. If patient does not wish to contact the pharmacy document the reason why and proceed with request.) (Agent: If yes, when and what did the pharmacy advise?)  This is the patient's preferred pharmacy:   [facility]  [place] - [address] [place] [address] Phone: [phone] Fax: [phone]  Is this the correct pharmacy for this prescription? Yes If no, delete pharmacy and type the correct one.   Has the prescription been filled recently? No  Is the patient out of the medication? Yes  Has the patient been seen for an appointment in the last year OR does the patient have an upcoming appointment? Yes  Can we respond through MyChart? No  Agent: Please be advised that Rx refills may take up to 3 business days. We ask that you follow-up with your pharmacy.

## 2024-01-19 MED ORDER — CIPROFLOXACIN HCL 0.3 % OP SOLN: OPHTHALMIC | 0 refills | Status: DC

## 2024-01-20 DIAGNOSIS — Z981 Arthrodesis status: Secondary | ICD-10-CM | POA: Diagnosis not present

## 2024-01-30 DIAGNOSIS — R509 Fever, unspecified: Secondary | ICD-10-CM | POA: Diagnosis not present

## 2024-02-24 ENCOUNTER — Telehealth: Payer: Self-pay

## 2024-02-24 DIAGNOSIS — M545 Low back pain, unspecified: Secondary | ICD-10-CM

## 2024-02-27 ENCOUNTER — Telehealth: Payer: Self-pay

## 2024-02-27 NOTE — Progress Notes (Unsigned)
 Complex Care Management Note Care Guide Note  02/27/2024 Name: Molly Murillo MRN: [id number] DOB: 06-10-1970   Complex Care Management Outreach Attempts: An unsuccessful telephone outreach was attempted today to offer the patient information about available complex care management services.  Follow Up Plan:  Additional outreach attempts will be made to offer the patient complex care management information and services.   Encounter Outcome:  Pt advised me to call back next week she is currently in hospital  Jeoffrey Buffalo , RMA     [facility]  [facility], [facility] Guide  Direct Dial: [phone]  Website: [email or web address]

## 2024-02-28 ENCOUNTER — Telehealth: Payer: Self-pay

## 2024-02-28 NOTE — Telephone Encounter (Signed)
 Copied from CRM #[id number]. Topic: Clinical - Medical Advice >> Feb 28, 2024  3:01 PM Fonda T wrote: Reason for CRM: Pt states she has been in Hospital [facility], admitted December 1, and is still in hospital with a potential discharge date of Tuesday 03/03/24.  Pt reports she will be leaving hospital with a wound that will need to be properly taken care of at home.  Pt is requesting Home Health for assistance with wound care.   Pt can be reached at [phone]  for follow up and to discuss further.   Pt is aware follow up call.

## 2024-03-02 NOTE — Telephone Encounter (Signed)
 This can be discussed at patient appointment on 03/11/2024

## 2024-03-03 ENCOUNTER — Other Ambulatory Visit: Payer: Self-pay | Admitting: Dermatology

## 2024-03-03 NOTE — Progress Notes (Signed)
 Complex Care Management Note  Care Guide Note 03/03/2024 Name: Molly Murillo MRN: [id number] DOB: 25-Jun-1970  Molly Murillo is a 54 y.o. year old female who sees Bacchus, Meade PEDLAR, FNP for primary care. I reached out to Molly JAYSON Hausmann by phone today to offer complex care management services.  [name] was given information about Complex Care Management services today including:   The Complex Care Management services include support from the care team which includes your Nurse Care Manager, Clinical Social Worker, or Pharmacist.  The Complex Care Management team is here to help remove barriers to the health concerns and goals most important to you. Complex Care Management services are voluntary, and the patient may decline or stop services at any time by request to their care team member.   Complex Care Management Consent Status: Patient did not agree to participate in complex care management services at this time.  Follow up plan:  Patient is still in hospital and will discharge to SNF   Jeoffrey Buffalo , RMA     [facility]  [facility], [facility] Guide  Direct Dial: [phone]  Website: [email or web address]

## 2024-03-11 ENCOUNTER — Ambulatory Visit: Payer: Self-pay

## 2024-03-11 VITALS — BP 146/87 | HR 80 | Ht 66.0 in | Wt 176.0 lb

## 2024-03-11 DIAGNOSIS — D509 Iron deficiency anemia, unspecified: Secondary | ICD-10-CM | POA: Diagnosis not present

## 2024-03-11 DIAGNOSIS — K219 Gastro-esophageal reflux disease without esophagitis: Secondary | ICD-10-CM | POA: Diagnosis not present

## 2024-03-11 DIAGNOSIS — E538 Deficiency of other specified B group vitamins: Secondary | ICD-10-CM | POA: Diagnosis not present

## 2024-03-11 DIAGNOSIS — H109 Unspecified conjunctivitis: Secondary | ICD-10-CM

## 2024-03-11 DIAGNOSIS — M7918 Myalgia, other site: Secondary | ICD-10-CM

## 2024-03-11 DIAGNOSIS — E038 Other specified hypothyroidism: Secondary | ICD-10-CM | POA: Diagnosis not present

## 2024-03-11 DIAGNOSIS — G629 Polyneuropathy, unspecified: Secondary | ICD-10-CM | POA: Diagnosis not present

## 2024-03-11 MED ORDER — CIPROFLOXACIN HCL 0.3 % OP SOLN: OPHTHALMIC | 0 refills | Status: AC

## 2024-03-11 MED ORDER — LEVOTHYROXINE SODIUM 25 MCG PO TABS: 25.0000 ug | ORAL_TABLET | Freq: Every day | ORAL | 11 refills | Status: AC

## 2024-03-11 MED ORDER — GABAPENTIN 300 MG PO CAPS: 300.0000 mg | ORAL_CAPSULE | Freq: Three times a day (TID) | ORAL | 11 refills | Status: AC

## 2024-03-11 MED ORDER — FOLIC ACID 1 MG PO TABS: 1.0000 mg | ORAL_TABLET | Freq: Every day | ORAL | 5 refills | Status: AC

## 2024-03-11 MED ORDER — FERROUS GLUCONATE 324 (38 FE) MG PO TABS: 324.0000 mg | ORAL_TABLET | Freq: Every day | ORAL | 5 refills | Status: AC

## 2024-03-11 MED ORDER — OMEPRAZOLE 40 MG PO CPDR: 40.0000 mg | DELAYED_RELEASE_CAPSULE | Freq: Every day | ORAL | 11 refills | Status: AC

## 2024-03-11 NOTE — Progress Notes (Signed)
 "  Established Patient Office Visit  Subjective   Patient ID: Molly Murillo, female    DOB: [birth year]  Age: 54 y.o. MRN: [id number]  Chief Complaint  Patient presents with   Hospitalization Follow-up    Pt here for hospital follow , Wound care already supposed to contact her about wound care, still have not heard anything back     HPI Discussed the use of AI scribe software for clinical note transcription with the patient, who gave verbal consent to proceed.  History of Present Illness    Molly Murillo is a 54 year old female who presents for follow-up after a recent hospitalization for a surgical wound infection.  Postoperative wound infection and sepsis - Hospitalized for over a month following back and neck surgery in October with placement of a rod and two screws. - Developed a deep surgical site infection requiring wound vacuum therapy and IV antibiotics. - Infection progressed to sepsis with involvement of all bodily fluids. - Received a six-week course of IV antibiotics, followed by a 30-day course of oral antibiotics. - Currently taking two oral antibiotics and scheduled for follow-up with infectious disease specialists.  Wound care management - Daughter is assisting with wound care at home. - Concern for adequacy of home wound care and risk of reinfection. - Awaiting contact from wound care specialist for further management. - Insurance coverage issues have limited access to professional home health wound care services.  Dermatologic symptoms - Uses doxycycline  for cystic acne, which worsened during hospitalization. - Requests refill of doxycycline .  Neuropathic symptoms - Takes gabapentin  for carpal tunnel syndrome.  Nutritional and hematologic support - Takes folic acid  and iron supplements. - Requests refills for folic acid  and iron pills.  Functional status and social determinants of health - Unable to work due to current medical condition. - Seeking  assistance with food and nutrition services. - In process of obtaining adult Medicaid to help cover medical expenses.     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   Impetigo 01/01/2023   Musculoskeletal pain 01/01/2023   Encounter for immunization 01/01/2023   Tension headache 07/30/2022   Bilateral swelling of feet and ankles 07/30/2022   Anxiety and depression 04/27/2022   Acute vaginitis 04/27/2022   Lumbar pain 01/25/2022   Need for immunization against influenza 01/25/2022   Uterine prolapse 09/25/2021   Bartholin cyst 09/25/2021   Hypothyroidism 09/25/2021   Cervical cancer screening 09/25/2021   Bacterial conjunctivitis of left eye 06/24/2021   Vitamin B1 deficiency 06/24/2021   Seizure disorder (HCC) 08/16/2020   Frequent headaches 12/15/2019   Iron deficiency anemia 12/15/2019   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  Constitutional:      Appearance: Normal appearance.  HENT:     Head: Normocephalic.  Eyes:     Extraocular Movements: Extraocular movements intact.     Pupils: Pupils are equal, round, and reactive to light.  Neck:   Cardiovascular:      Rate and Rhythm: Normal rate and regular rhythm.  Pulmonary:     Effort: Pulmonary effort is normal.     Breath sounds: Normal breath sounds.  Musculoskeletal:     Cervical back: Normal range of motion and neck supple.  Neurological:     Mental Status: She is alert and oriented to person, place, and time.  Psychiatric:        Mood and Affect: Mood normal.        Thought Content: Thought content normal.      No results found for any visits on 03/11/24.    The 10-year ASCVD risk score (Arnett DK, et al., 2019) is: 6.3%    Assessment & Plan:   Problem List Items Addressed This Visit       Digestive   Gastroesophageal reflux disease   Managed with omeprazole . - Ensured omeprazole  prescription was refilled and sent to [facility].      Relevant Medications   omeprazole  (PRILOSEC) 40 MG capsule     Endocrine   Hypothyroidism   Managed with levothyroxine . - Ensured levothyroxine  prescription was refilled and sent to [facility].      Relevant Medications   levothyroxine  (SYNTHROID ) 25 MCG tablet     Nervous and Auditory   Neuropathy   Ongoing symptoms reported. - Ensured gabapentin  prescription was refilled and sent to [facility].      Relevant Medications   gabapentin  (NEURONTIN ) 300 MG capsule     Other   Bacterial conjunctivitis of left eye   Bacterial conjunctivitis likely secondary to systemic infection during hospitalization.      Relevant Medications   ciprofloxacin  (CILOXAN ) 0.3 % ophthalmic solution   Musculoskeletal pain   Managed with naproxen .      Iron deficiency anemia - Primary   Difficulty with over-the-counter iron supplements noted. - Refilled iron supplement prescription.      Relevant Medications   folic acid  (FOLVITE ) 1 MG tablet   ferrous gluconate  (FERGON) 324 MG tablet   Folic acid  deficiency   - Refilled folic acid  prescription.      Relevant Medications   folic acid  (FOLVITE ) 1 MG tablet        No follow-ups on file.     Leita Longs, FNP  "

## 2024-03-18 ENCOUNTER — Telehealth: Payer: Self-pay | Admitting: Family Medicine

## 2024-03-18 DIAGNOSIS — E538 Deficiency of other specified B group vitamins: Secondary | ICD-10-CM | POA: Insufficient documentation

## 2024-03-18 DIAGNOSIS — K219 Gastro-esophageal reflux disease without esophagitis: Secondary | ICD-10-CM | POA: Insufficient documentation

## 2024-03-18 NOTE — Assessment & Plan Note (Signed)
 Managed with levothyroxine . - Ensured levothyroxine  prescription was refilled and sent to [name].

## 2024-03-18 NOTE — Assessment & Plan Note (Signed)
 Difficulty with over-the-counter iron supplements noted. - Refilled iron supplement prescription.

## 2024-03-18 NOTE — Assessment & Plan Note (Signed)
 Managed with naproxen .

## 2024-03-18 NOTE — Assessment & Plan Note (Signed)
 Managed with omeprazole . - Ensured omeprazole  prescription was refilled and sent to [facility].

## 2024-03-18 NOTE — Assessment & Plan Note (Signed)
 Ongoing symptoms reported. - Ensured gabapentin  prescription was refilled and sent to [facility].

## 2024-03-18 NOTE — Assessment & Plan Note (Signed)
 Bacterial conjunctivitis likely secondary to systemic infection during hospitalization.

## 2024-03-18 NOTE — Telephone Encounter (Signed)
 P[facility] forms Noted Copied Sleeved Original placed provider box (laura huenink last seen 01.26.2026) Copy placed front desk folder

## 2024-03-18 NOTE — Assessment & Plan Note (Signed)
-   Refilled folic acid  prescription.

## 2024-03-20 ENCOUNTER — Other Ambulatory Visit: Payer: Self-pay | Admitting: Dermatology

## 2024-06-10 ENCOUNTER — Ambulatory Visit: Payer: Self-pay
